# Patient Record
Sex: Male | Born: 1935 | Race: White | Hispanic: No | Marital: Single
Health system: Southern US, Community
[De-identification: ages and names within clinical notes are randomized; demographics above are authoritative.]

---

## 2000-01-15 ENCOUNTER — Encounter: Payer: Self-pay | Admitting: Vascular Surgery

## 2000-01-19 ENCOUNTER — Encounter: Payer: Self-pay | Admitting: Vascular Surgery

## 2000-01-19 ENCOUNTER — Inpatient Hospital Stay (HOSPITAL_COMMUNITY): Admission: RE | Admit: 2000-01-19 | Discharge: 2000-01-23 | Payer: Self-pay | Admitting: Vascular Surgery

## 2000-02-25 ENCOUNTER — Observation Stay (HOSPITAL_COMMUNITY): Admission: RE | Admit: 2000-02-25 | Discharge: 2000-02-26 | Payer: Self-pay | Admitting: Vascular Surgery

## 2004-02-27 ENCOUNTER — Ambulatory Visit: Payer: Self-pay | Admitting: Urology

## 2004-10-26 ENCOUNTER — Other Ambulatory Visit: Payer: Self-pay

## 2004-10-26 ENCOUNTER — Inpatient Hospital Stay: Payer: Self-pay

## 2005-06-08 ENCOUNTER — Ambulatory Visit: Payer: Self-pay | Admitting: Vascular Surgery

## 2005-10-03 ENCOUNTER — Inpatient Hospital Stay: Payer: Self-pay | Admitting: Unknown Physician Specialty

## 2005-10-14 ENCOUNTER — Ambulatory Visit: Payer: Self-pay | Admitting: Unknown Physician Specialty

## 2005-10-15 ENCOUNTER — Ambulatory Visit: Payer: Self-pay | Admitting: Unknown Physician Specialty

## 2005-12-21 ENCOUNTER — Ambulatory Visit: Payer: Self-pay | Admitting: Ophthalmology

## 2005-12-27 ENCOUNTER — Ambulatory Visit: Payer: Self-pay | Admitting: Ophthalmology

## 2006-07-26 ENCOUNTER — Ambulatory Visit: Payer: Self-pay | Admitting: Internal Medicine

## 2006-08-03 ENCOUNTER — Ambulatory Visit: Payer: Self-pay | Admitting: Vascular Surgery

## 2006-08-16 ENCOUNTER — Ambulatory Visit: Payer: Self-pay | Admitting: Internal Medicine

## 2006-09-15 ENCOUNTER — Ambulatory Visit: Payer: Self-pay | Admitting: Internal Medicine

## 2006-09-22 ENCOUNTER — Other Ambulatory Visit: Payer: Self-pay

## 2006-09-22 ENCOUNTER — Ambulatory Visit: Payer: Self-pay | Admitting: Gastroenterology

## 2006-09-27 ENCOUNTER — Ambulatory Visit: Payer: Self-pay | Admitting: Gastroenterology

## 2006-10-16 ENCOUNTER — Ambulatory Visit: Payer: Self-pay | Admitting: Internal Medicine

## 2006-11-15 ENCOUNTER — Ambulatory Visit: Payer: Self-pay | Admitting: Internal Medicine

## 2006-12-16 ENCOUNTER — Ambulatory Visit: Payer: Self-pay | Admitting: Internal Medicine

## 2007-01-16 ENCOUNTER — Ambulatory Visit: Payer: Self-pay | Admitting: Internal Medicine

## 2007-02-01 ENCOUNTER — Ambulatory Visit: Payer: Self-pay | Admitting: Podiatry

## 2007-02-15 ENCOUNTER — Ambulatory Visit: Payer: Self-pay | Admitting: Internal Medicine

## 2007-03-18 ENCOUNTER — Ambulatory Visit: Payer: Self-pay | Admitting: Internal Medicine

## 2007-04-17 ENCOUNTER — Ambulatory Visit: Payer: Self-pay | Admitting: Internal Medicine

## 2007-04-18 ENCOUNTER — Ambulatory Visit: Payer: Self-pay | Admitting: Internal Medicine

## 2007-05-18 ENCOUNTER — Ambulatory Visit: Payer: Self-pay | Admitting: Internal Medicine

## 2007-06-13 ENCOUNTER — Ambulatory Visit: Payer: Self-pay | Admitting: Vascular Surgery

## 2007-09-15 ENCOUNTER — Ambulatory Visit: Payer: Self-pay | Admitting: Internal Medicine

## 2007-10-04 ENCOUNTER — Ambulatory Visit: Payer: Self-pay | Admitting: Internal Medicine

## 2007-10-06 ENCOUNTER — Other Ambulatory Visit: Payer: Self-pay

## 2007-10-06 ENCOUNTER — Emergency Department: Payer: Self-pay | Admitting: Emergency Medicine

## 2007-10-16 ENCOUNTER — Ambulatory Visit: Payer: Self-pay | Admitting: Internal Medicine

## 2008-04-18 ENCOUNTER — Ambulatory Visit: Payer: Self-pay | Admitting: Ophthalmology

## 2008-05-07 ENCOUNTER — Ambulatory Visit: Payer: Self-pay | Admitting: Ophthalmology

## 2008-12-18 ENCOUNTER — Ambulatory Visit: Payer: Self-pay | Admitting: Podiatry

## 2009-01-07 ENCOUNTER — Ambulatory Visit: Payer: Self-pay | Admitting: Podiatry

## 2009-01-10 ENCOUNTER — Ambulatory Visit: Payer: Self-pay | Admitting: Podiatry

## 2009-04-22 ENCOUNTER — Ambulatory Visit: Payer: Self-pay | Admitting: Podiatry

## 2009-04-23 ENCOUNTER — Inpatient Hospital Stay: Payer: Self-pay | Admitting: Podiatry

## 2009-05-22 ENCOUNTER — Inpatient Hospital Stay: Payer: Self-pay | Admitting: Internal Medicine

## 2009-07-28 ENCOUNTER — Ambulatory Visit: Payer: Self-pay | Admitting: Gastroenterology

## 2009-10-21 ENCOUNTER — Emergency Department: Payer: Self-pay | Admitting: Emergency Medicine

## 2009-10-24 ENCOUNTER — Observation Stay: Payer: Self-pay | Admitting: Internal Medicine

## 2010-11-17 ENCOUNTER — Ambulatory Visit: Payer: Self-pay | Admitting: Internal Medicine

## 2010-12-03 ENCOUNTER — Ambulatory Visit: Payer: Self-pay | Admitting: Vascular Surgery

## 2010-12-09 ENCOUNTER — Ambulatory Visit: Payer: Self-pay | Admitting: Vascular Surgery

## 2010-12-25 ENCOUNTER — Ambulatory Visit: Payer: Self-pay | Admitting: Family Medicine

## 2011-01-20 ENCOUNTER — Inpatient Hospital Stay: Payer: Self-pay | Admitting: Internal Medicine

## 2011-01-29 DIAGNOSIS — R072 Precordial pain: Secondary | ICD-10-CM

## 2011-02-01 ENCOUNTER — Encounter: Payer: Self-pay | Admitting: Internal Medicine

## 2011-02-15 ENCOUNTER — Encounter: Payer: Self-pay | Admitting: Internal Medicine

## 2011-03-18 ENCOUNTER — Encounter: Payer: Self-pay | Admitting: Internal Medicine

## 2011-03-22 ENCOUNTER — Inpatient Hospital Stay: Payer: Self-pay | Admitting: Vascular Surgery

## 2011-04-05 ENCOUNTER — Encounter: Payer: Self-pay | Admitting: Internal Medicine

## 2011-04-17 ENCOUNTER — Encounter: Payer: Self-pay | Admitting: Internal Medicine

## 2011-04-30 ENCOUNTER — Ambulatory Visit: Payer: Self-pay | Admitting: Podiatry

## 2011-05-03 LAB — PATHOLOGY REPORT

## 2011-05-18 ENCOUNTER — Encounter: Payer: Self-pay | Admitting: Internal Medicine

## 2011-06-18 ENCOUNTER — Encounter: Payer: Self-pay | Admitting: Internal Medicine

## 2011-06-24 ENCOUNTER — Ambulatory Visit: Payer: Self-pay | Admitting: Podiatry

## 2011-06-24 LAB — BASIC METABOLIC PANEL
Anion Gap: 8 (ref 7–16)
BUN: 41 mg/dL — ABNORMAL HIGH (ref 7–18)
Calcium, Total: 9.3 mg/dL (ref 8.5–10.1)
Chloride: 108 mmol/L — ABNORMAL HIGH (ref 98–107)
Co2: 28 mmol/L (ref 21–32)
Creatinine: 1.75 mg/dL — ABNORMAL HIGH (ref 0.60–1.30)
EGFR (African American): 49 — ABNORMAL LOW
EGFR (Non-African Amer.): 41 — ABNORMAL LOW
Glucose: 139 mg/dL — ABNORMAL HIGH (ref 65–99)
Osmolality: 299 (ref 275–301)
Potassium: 4.4 mmol/L (ref 3.5–5.1)
Sodium: 144 mmol/L (ref 136–145)

## 2011-06-24 LAB — WBC: WBC: 7.3 10*3/uL (ref 3.8–10.6)

## 2011-08-03 ENCOUNTER — Other Ambulatory Visit: Payer: Self-pay | Admitting: Podiatry

## 2011-08-08 LAB — WOUND CULTURE

## 2011-08-16 ENCOUNTER — Ambulatory Visit: Payer: Self-pay | Admitting: Internal Medicine

## 2011-08-18 ENCOUNTER — Ambulatory Visit: Payer: Self-pay | Admitting: Podiatry

## 2011-08-25 ENCOUNTER — Inpatient Hospital Stay: Payer: Self-pay | Admitting: Internal Medicine

## 2011-08-25 LAB — BASIC METABOLIC PANEL
Anion Gap: 7 (ref 7–16)
BUN: 42 mg/dL — ABNORMAL HIGH (ref 7–18)
Calcium, Total: 8.7 mg/dL (ref 8.5–10.1)
Chloride: 103 mmol/L (ref 98–107)
Co2: 29 mmol/L (ref 21–32)
Creatinine: 2.34 mg/dL — ABNORMAL HIGH (ref 0.60–1.30)
EGFR (African American): 35 — ABNORMAL LOW
EGFR (Non-African Amer.): 29 — ABNORMAL LOW
Glucose: 115 mg/dL — ABNORMAL HIGH (ref 65–99)
Osmolality: 289 (ref 275–301)
Potassium: 3.9 mmol/L (ref 3.5–5.1)
Sodium: 139 mmol/L (ref 136–145)

## 2011-08-25 LAB — CBC WITH DIFFERENTIAL/PLATELET
Basophil #: 0 10*3/uL (ref 0.0–0.1)
Basophil %: 0.5 %
Eosinophil #: 0 10*3/uL (ref 0.0–0.7)
Eosinophil %: 0.5 %
HCT: 29.6 % — ABNORMAL LOW (ref 40.0–52.0)
HGB: 9.7 g/dL — ABNORMAL LOW (ref 13.0–18.0)
Lymphocyte #: 1.5 10*3/uL (ref 1.0–3.6)
Lymphocyte %: 22 %
MCH: 27.6 pg (ref 26.0–34.0)
MCHC: 32.8 g/dL (ref 32.0–36.0)
MCV: 84 fL (ref 80–100)
Monocyte #: 1.2 x10 3/mm — ABNORMAL HIGH (ref 0.2–1.0)
Monocyte %: 17.1 %
Neutrophil #: 4.1 10*3/uL (ref 1.4–6.5)
Neutrophil %: 59.9 %
Platelet: 185 10*3/uL (ref 150–440)
RBC: 3.51 10*6/uL — ABNORMAL LOW (ref 4.40–5.90)
RDW: 16.5 % — ABNORMAL HIGH (ref 11.5–14.5)
WBC: 6.8 10*3/uL (ref 3.8–10.6)

## 2011-08-27 LAB — BASIC METABOLIC PANEL
Anion Gap: 9 (ref 7–16)
BUN: 44 mg/dL — ABNORMAL HIGH (ref 7–18)
Calcium, Total: 8.1 mg/dL — ABNORMAL LOW (ref 8.5–10.1)
Chloride: 100 mmol/L (ref 98–107)
Co2: 25 mmol/L (ref 21–32)
Creatinine: 2.15 mg/dL — ABNORMAL HIGH (ref 0.60–1.30)
EGFR (African American): 34 — ABNORMAL LOW
EGFR (Non-African Amer.): 29 — ABNORMAL LOW
Glucose: 115 mg/dL — ABNORMAL HIGH (ref 65–99)
Osmolality: 280 (ref 275–301)
Potassium: 3.8 mmol/L (ref 3.5–5.1)
Sodium: 134 mmol/L — ABNORMAL LOW (ref 136–145)

## 2011-08-27 LAB — PATHOLOGY REPORT

## 2011-08-28 LAB — BASIC METABOLIC PANEL
BUN: 46 mg/dL — ABNORMAL HIGH (ref 7–18)
Calcium, Total: 8.4 mg/dL — ABNORMAL LOW (ref 8.5–10.1)
Co2: 24 mmol/L (ref 21–32)
Creatinine: 2.41 mg/dL — ABNORMAL HIGH (ref 0.60–1.30)
EGFR (Non-African Amer.): 25 — ABNORMAL LOW
Osmolality: 286 (ref 275–301)
Sodium: 134 mmol/L — ABNORMAL LOW (ref 136–145)

## 2011-08-29 LAB — CBC WITH DIFFERENTIAL/PLATELET
Basophil #: 0 10*3/uL (ref 0.0–0.1)
Basophil %: 0.2 %
Basophil %: 0.1 %
Eosinophil #: 0 10*3/uL (ref 0.0–0.7)
HCT: 26.2 % — ABNORMAL LOW (ref 40.0–52.0)
HGB: 8.6 g/dL — ABNORMAL LOW (ref 13.0–18.0)
HGB: 8.7 g/dL — ABNORMAL LOW (ref 13.0–18.0)
Lymphocyte #: 0.9 10*3/uL — ABNORMAL LOW (ref 1.0–3.6)
Lymphocyte #: 0.8 10*3/uL — ABNORMAL LOW (ref 1.0–3.6)
Lymphocyte %: 10.3 %
Lymphocyte %: 8.2 %
MCHC: 32.7 g/dL (ref 32.0–36.0)
MCHC: 33 g/dL (ref 32.0–36.0)
MCV: 84 fL (ref 80–100)
Monocyte #: 1.1 x10 3/mm — ABNORMAL HIGH (ref 0.2–1.0)
Monocyte #: 0.4 x10 3/mm (ref 0.2–1.0)
Monocyte %: 11.8 %
Monocyte %: 4.7 %
Neutrophil #: 6.6 10*3/uL — ABNORMAL HIGH (ref 1.4–6.5)
Neutrophil %: 73.9 %
Platelet: 162 10*3/uL (ref 150–440)
RBC: 3.16 10*6/uL — ABNORMAL LOW (ref 4.40–5.90)
RDW: 16.3 % — ABNORMAL HIGH (ref 11.5–14.5)

## 2011-08-29 LAB — BASIC METABOLIC PANEL
Anion Gap: 10 (ref 7–16)
BUN: 54 mg/dL — ABNORMAL HIGH (ref 7–18)
Calcium, Total: 8.3 mg/dL — ABNORMAL LOW (ref 8.5–10.1)
Co2: 26 mmol/L (ref 21–32)
Creatinine: 2.77 mg/dL — ABNORMAL HIGH (ref 0.60–1.30)
EGFR (African American): 25 — ABNORMAL LOW
EGFR (Non-African Amer.): 22 — ABNORMAL LOW
Glucose: 167 mg/dL — ABNORMAL HIGH (ref 65–99)
Sodium: 130 mmol/L — ABNORMAL LOW (ref 136–145)

## 2011-08-29 LAB — TROPONIN I: Troponin-I: 0.45 ng/mL — ABNORMAL HIGH

## 2011-08-29 LAB — CK-MB: CK-MB: 1.3 ng/mL (ref 0.5–3.6)

## 2011-08-30 LAB — COMPREHENSIVE METABOLIC PANEL
Albumin: 2.2 g/dL — ABNORMAL LOW (ref 3.4–5.0)
Alkaline Phosphatase: 88 U/L (ref 50–136)
Anion Gap: 11 (ref 7–16)
Bilirubin,Total: 0.5 mg/dL (ref 0.2–1.0)
Co2: 23 mmol/L (ref 21–32)
EGFR (African American): 26 — ABNORMAL LOW
EGFR (Non-African Amer.): 22 — ABNORMAL LOW
Potassium: 3.7 mmol/L (ref 3.5–5.1)
SGOT(AST): 32 U/L (ref 15–37)
Sodium: 135 mmol/L — ABNORMAL LOW (ref 136–145)
Total Protein: 6 g/dL — ABNORMAL LOW (ref 6.4–8.2)

## 2011-08-30 LAB — MAGNESIUM
Magnesium: 2.6 mg/dL — ABNORMAL HIGH
Magnesium: 2.5 mg/dL — ABNORMAL HIGH

## 2011-08-30 LAB — CBC WITH DIFFERENTIAL/PLATELET
Basophil #: 0 10*3/uL (ref 0.0–0.1)
Eosinophil #: 0 10*3/uL (ref 0.0–0.7)
Eosinophil %: 0.1 %
HGB: 8.7 g/dL — ABNORMAL LOW (ref 13.0–18.0)
Lymphocyte #: 1 10*3/uL (ref 1.0–3.6)
MCHC: 32.3 g/dL (ref 32.0–36.0)
MCV: 83 fL (ref 80–100)
Monocyte #: 0.4 x10 3/mm (ref 0.2–1.0)
Monocyte %: 4.2 %
Neutrophil #: 7.7 10*3/uL — ABNORMAL HIGH (ref 1.4–6.5)
RBC: 3.25 10*6/uL — ABNORMAL LOW (ref 4.40–5.90)
RDW: 16.5 % — ABNORMAL HIGH (ref 11.5–14.5)
WBC: 9.1 10*3/uL (ref 3.8–10.6)

## 2011-08-30 LAB — TROPONIN I
Troponin-I: 0.51 ng/mL — ABNORMAL HIGH
Troponin-I: 0.5 ng/mL — ABNORMAL HIGH

## 2011-08-30 LAB — BASIC METABOLIC PANEL
Anion Gap: 12 (ref 7–16)
BUN: 53 mg/dL — ABNORMAL HIGH (ref 7–18)
Calcium, Total: 8.2 mg/dL — ABNORMAL LOW (ref 8.5–10.1)
Chloride: 101 mmol/L (ref 98–107)
EGFR (African American): 30 — ABNORMAL LOW
EGFR (Non-African Amer.): 26 — ABNORMAL LOW
Glucose: 333 mg/dL — ABNORMAL HIGH (ref 65–99)
Sodium: 135 mmol/L — ABNORMAL LOW (ref 136–145)

## 2011-08-30 LAB — TSH: Thyroid Stimulating Horm: 0.605 u[IU]/mL

## 2011-08-31 LAB — CBC WITH DIFFERENTIAL/PLATELET
Eosinophil %: 0.1 %
Lymphocyte #: 1 10*3/uL (ref 1.0–3.6)
Lymphocyte %: 8.2 %
MCH: 26.6 pg (ref 26.0–34.0)
MCHC: 32.5 g/dL (ref 32.0–36.0)
MCV: 82 fL (ref 80–100)
Neutrophil #: 10.1 10*3/uL — ABNORMAL HIGH (ref 1.4–6.5)
Neutrophil %: 86.3 %
Platelet: 198 10*3/uL (ref 150–440)
RBC: 3.23 10*6/uL — ABNORMAL LOW (ref 4.40–5.90)
RDW: 16.4 % — ABNORMAL HIGH (ref 11.5–14.5)
WBC: 11.7 10*3/uL — ABNORMAL HIGH (ref 3.8–10.6)

## 2011-08-31 LAB — BASIC METABOLIC PANEL
Anion Gap: 11 (ref 7–16)
BUN: 46 mg/dL — ABNORMAL HIGH (ref 7–18)
Calcium, Total: 8.3 mg/dL — ABNORMAL LOW (ref 8.5–10.1)
Creatinine: 2.08 mg/dL — ABNORMAL HIGH (ref 0.60–1.30)
EGFR (African American): 35 — ABNORMAL LOW
Osmolality: 297 (ref 275–301)
Potassium: 3.1 mmol/L — ABNORMAL LOW (ref 3.5–5.1)

## 2011-08-31 LAB — WOUND CULTURE

## 2011-09-01 LAB — CBC WITH DIFFERENTIAL/PLATELET
Basophil #: 0 10*3/uL (ref 0.0–0.1)
Basophil %: 0.1 %
Eosinophil #: 0 10*3/uL (ref 0.0–0.7)
Eosinophil %: 0 %
HCT: 26.4 % — ABNORMAL LOW (ref 40.0–52.0)
HGB: 8.4 g/dL — ABNORMAL LOW (ref 13.0–18.0)
Lymphocyte #: 1 10*3/uL (ref 1.0–3.6)
MCH: 26.6 pg (ref 26.0–34.0)
MCHC: 31.8 g/dL — ABNORMAL LOW (ref 32.0–36.0)
MCV: 84 fL (ref 80–100)
Monocyte #: 0.7 x10 3/mm (ref 0.2–1.0)
Monocyte %: 5 %
Neutrophil #: 12.4 10*3/uL — ABNORMAL HIGH (ref 1.4–6.5)
Neutrophil %: 87.6 %
Platelet: 233 10*3/uL (ref 150–440)
RDW: 17 % — ABNORMAL HIGH (ref 11.5–14.5)

## 2011-09-01 LAB — BASIC METABOLIC PANEL
BUN: 48 mg/dL — ABNORMAL HIGH (ref 7–18)
Co2: 23 mmol/L (ref 21–32)
Creatinine: 1.98 mg/dL — ABNORMAL HIGH (ref 0.60–1.30)
EGFR (African American): 37 — ABNORMAL LOW
Glucose: 272 mg/dL — ABNORMAL HIGH (ref 65–99)
Potassium: 3.4 mmol/L — ABNORMAL LOW (ref 3.5–5.1)

## 2011-09-01 LAB — MAGNESIUM: Magnesium: 2.6 mg/dL — ABNORMAL HIGH

## 2011-09-01 LAB — PHOSPHORUS: Phosphorus: 3.7 mg/dL (ref 2.5–4.9)

## 2011-09-01 LAB — POTASSIUM: Potassium: 4.4 mmol/L (ref 3.5–5.1)

## 2011-09-02 LAB — URINALYSIS, COMPLETE
Bilirubin,UR: NEGATIVE
Nitrite: NEGATIVE
Ph: 5 (ref 4.5–8.0)
RBC,UR: 104 /HPF (ref 0–5)
WBC UR: 2 /HPF (ref 0–5)

## 2011-09-02 LAB — CBC WITH DIFFERENTIAL/PLATELET
HCT: 27.4 % — ABNORMAL LOW (ref 40.0–52.0)
HGB: 8.6 g/dL — ABNORMAL LOW (ref 13.0–18.0)
Myelocyte: 4 %
RBC: 3.21 10*6/uL — ABNORMAL LOW (ref 4.40–5.90)
RDW: 17.2 % — ABNORMAL HIGH (ref 11.5–14.5)
WBC: 14.1 10*3/uL — ABNORMAL HIGH (ref 3.8–10.6)

## 2011-09-02 LAB — BASIC METABOLIC PANEL
BUN: 64 mg/dL — ABNORMAL HIGH (ref 7–18)
Calcium, Total: 8.4 mg/dL — ABNORMAL LOW (ref 8.5–10.1)
Chloride: 105 mmol/L (ref 98–107)
Sodium: 138 mmol/L (ref 136–145)

## 2011-09-02 LAB — PROTEIN / CREATININE RATIO, URINE
Creatinine, Urine: 200.1 mg/dL — ABNORMAL HIGH (ref 30.0–125.0)
Protein/Creat. Ratio: 425 mg/gCREAT — ABNORMAL HIGH (ref 0–200)

## 2011-09-02 LAB — PHOSPHORUS: Phosphorus: 4.3 mg/dL (ref 2.5–4.9)

## 2011-09-03 LAB — CBC WITH DIFFERENTIAL/PLATELET
Basophil #: 0 10*3/uL (ref 0.0–0.1)
Basophil %: 0.1 %
Lymphocyte #: 1.4 10*3/uL (ref 1.0–3.6)
MCH: 26.8 pg (ref 26.0–34.0)
MCHC: 31.9 g/dL — ABNORMAL LOW (ref 32.0–36.0)
MCV: 84 fL (ref 80–100)
Monocyte #: 0.6 x10 3/mm (ref 0.2–1.0)
Monocyte %: 4.4 %
Neutrophil #: 11.9 10*3/uL — ABNORMAL HIGH (ref 1.4–6.5)
Platelet: 222 10*3/uL (ref 150–440)
RDW: 16.9 % — ABNORMAL HIGH (ref 11.5–14.5)

## 2011-09-03 LAB — BASIC METABOLIC PANEL
Anion Gap: 12 (ref 7–16)
BUN: 80 mg/dL — ABNORMAL HIGH (ref 7–18)
Calcium, Total: 8 mg/dL — ABNORMAL LOW (ref 8.5–10.1)
Co2: 20 mmol/L — ABNORMAL LOW (ref 21–32)
Creatinine: 2.32 mg/dL — ABNORMAL HIGH (ref 0.60–1.30)
EGFR (Non-African Amer.): 27 — ABNORMAL LOW
Glucose: 236 mg/dL — ABNORMAL HIGH (ref 65–99)
Osmolality: 306 (ref 275–301)
Potassium: 4.3 mmol/L (ref 3.5–5.1)
Sodium: 137 mmol/L (ref 136–145)

## 2011-09-04 LAB — CBC WITH DIFFERENTIAL/PLATELET
Basophil %: 0 %
Eosinophil %: 0.1 %
Lymphocyte #: 1.8 10*3/uL (ref 1.0–3.6)
Lymphocyte %: 10.2 %
MCH: 26.6 pg (ref 26.0–34.0)
MCHC: 32 g/dL (ref 32.0–36.0)
MCV: 83 fL (ref 80–100)
Monocyte %: 6.5 %
Neutrophil #: 14.6 10*3/uL — ABNORMAL HIGH (ref 1.4–6.5)
Neutrophil %: 83.2 %
RBC: 3.41 10*6/uL — ABNORMAL LOW (ref 4.40–5.90)
RDW: 17.3 % — ABNORMAL HIGH (ref 11.5–14.5)
WBC: 17.6 10*3/uL — ABNORMAL HIGH (ref 3.8–10.6)

## 2011-09-04 LAB — BASIC METABOLIC PANEL
Anion Gap: 13 (ref 7–16)
Calcium, Total: 8.1 mg/dL — ABNORMAL LOW (ref 8.5–10.1)
Chloride: 106 mmol/L (ref 98–107)
Co2: 22 mmol/L (ref 21–32)
Creatinine: 2.04 mg/dL — ABNORMAL HIGH (ref 0.60–1.30)
Osmolality: 315 (ref 275–301)
Sodium: 141 mmol/L (ref 136–145)

## 2011-09-05 LAB — BASIC METABOLIC PANEL
BUN: 91 mg/dL — ABNORMAL HIGH (ref 7–18)
Calcium, Total: 7.9 mg/dL — ABNORMAL LOW (ref 8.5–10.1)
Chloride: 111 mmol/L — ABNORMAL HIGH (ref 98–107)
Creatinine: 1.87 mg/dL — ABNORMAL HIGH (ref 0.60–1.30)
EGFR (African American): 40 — ABNORMAL LOW
Osmolality: 320 (ref 275–301)
Sodium: 145 mmol/L (ref 136–145)

## 2011-09-05 LAB — CBC WITH DIFFERENTIAL/PLATELET
Basophil #: 0 10*3/uL (ref 0.0–0.1)
Basophil %: 0.1 %
HCT: 28.3 % — ABNORMAL LOW (ref 40.0–52.0)
HGB: 9 g/dL — ABNORMAL LOW (ref 13.0–18.0)
Lymphocyte #: 1.8 10*3/uL (ref 1.0–3.6)
Lymphocyte %: 12.8 %
MCH: 26.7 pg (ref 26.0–34.0)
MCV: 84 fL (ref 80–100)
Monocyte #: 0.9 x10 3/mm (ref 0.2–1.0)
Monocyte %: 6.4 %
Neutrophil #: 11.6 10*3/uL — ABNORMAL HIGH (ref 1.4–6.5)
Neutrophil %: 79.8 %
Platelet: 216 10*3/uL (ref 150–440)
RDW: 17.5 % — ABNORMAL HIGH (ref 11.5–14.5)
WBC: 14.5 10*3/uL — ABNORMAL HIGH (ref 3.8–10.6)

## 2011-09-06 LAB — EXPECTORATED SPUTUM ASSESSMENT W GRAM STAIN, RFLX TO RESP C

## 2011-09-06 LAB — CBC WITH DIFFERENTIAL/PLATELET
Eosinophil %: 4.9 %
HCT: 30.4 % — ABNORMAL LOW (ref 40.0–52.0)
Lymphocyte #: 2.3 10*3/uL (ref 1.0–3.6)
Lymphocyte %: 17.9 %
MCH: 26.7 pg (ref 26.0–34.0)
MCHC: 31.2 g/dL — ABNORMAL LOW (ref 32.0–36.0)
MCV: 86 fL (ref 80–100)
Monocyte %: 6.2 %
Neutrophil %: 70.9 %
Platelet: 203 10*3/uL (ref 150–440)
RDW: 17.7 % — ABNORMAL HIGH (ref 11.5–14.5)

## 2011-09-06 LAB — BASIC METABOLIC PANEL
BUN: 82 mg/dL — ABNORMAL HIGH (ref 7–18)
Calcium, Total: 7.7 mg/dL — ABNORMAL LOW (ref 8.5–10.1)
EGFR (African American): 47 — ABNORMAL LOW
EGFR (Non-African Amer.): 40 — ABNORMAL LOW
Osmolality: 319 (ref 275–301)
Potassium: 4.4 mmol/L (ref 3.5–5.1)
Sodium: 148 mmol/L — ABNORMAL HIGH (ref 136–145)

## 2011-09-07 LAB — CBC WITH DIFFERENTIAL/PLATELET
Basophil #: 0 10*3/uL (ref 0.0–0.1)
Basophil %: 0.2 %
Eosinophil %: 4.6 %
Lymphocyte %: 15.4 %
MCV: 85 fL (ref 80–100)
Monocyte #: 0.7 x10 3/mm (ref 0.2–1.0)
Neutrophil #: 8.5 10*3/uL — ABNORMAL HIGH (ref 1.4–6.5)
Neutrophil %: 74.1 %
Platelet: 198 10*3/uL (ref 150–440)
RBC: 3.92 10*6/uL — ABNORMAL LOW (ref 4.40–5.90)
RDW: 17.9 % — ABNORMAL HIGH (ref 11.5–14.5)
WBC: 11.5 10*3/uL — ABNORMAL HIGH (ref 3.8–10.6)

## 2011-09-07 LAB — COMPREHENSIVE METABOLIC PANEL
Alkaline Phosphatase: 130 U/L (ref 50–136)
BUN: 65 mg/dL — ABNORMAL HIGH (ref 7–18)
Chloride: 112 mmol/L — ABNORMAL HIGH (ref 98–107)
Co2: 25 mmol/L (ref 21–32)
EGFR (African American): 58 — ABNORMAL LOW
Glucose: 169 mg/dL — ABNORMAL HIGH (ref 65–99)
Osmolality: 313 (ref 275–301)
Potassium: 4.5 mmol/L (ref 3.5–5.1)
SGPT (ALT): 15 U/L
Total Protein: 5.9 g/dL — ABNORMAL LOW (ref 6.4–8.2)

## 2011-09-07 LAB — RENAL FUNCTION PANEL
Anion Gap: 9 (ref 7–16)
Calcium, Total: 8.2 mg/dL — ABNORMAL LOW (ref 8.5–10.1)
Chloride: 112 mmol/L — ABNORMAL HIGH (ref 98–107)
Co2: 25 mmol/L (ref 21–32)
Creatinine: 1.29 mg/dL (ref 0.60–1.30)
EGFR (Non-African Amer.): 54 — ABNORMAL LOW
Glucose: 170 mg/dL — ABNORMAL HIGH (ref 65–99)
Osmolality: 314 (ref 275–301)
Sodium: 146 mmol/L — ABNORMAL HIGH (ref 136–145)

## 2011-09-08 LAB — CBC WITH DIFFERENTIAL/PLATELET
Basophil #: 0 10*3/uL (ref 0.0–0.1)
Basophil %: 0.2 %
Eosinophil %: 2.3 %
HGB: 10.8 g/dL — ABNORMAL LOW (ref 13.0–18.0)
Lymphocyte #: 1.9 10*3/uL (ref 1.0–3.6)
Lymphocyte %: 16.9 %
MCH: 26.7 pg (ref 26.0–34.0)
MCHC: 31.6 g/dL — ABNORMAL LOW (ref 32.0–36.0)
MCV: 85 fL (ref 80–100)
Monocyte #: 0.8 x10 3/mm (ref 0.2–1.0)
Monocyte %: 6.9 %
Neutrophil %: 73.7 %
Platelet: 182 10*3/uL (ref 150–440)
RBC: 4.02 10*6/uL — ABNORMAL LOW (ref 4.40–5.90)
RDW: 18.1 % — ABNORMAL HIGH (ref 11.5–14.5)

## 2011-09-08 LAB — RENAL FUNCTION PANEL
Anion Gap: 8 (ref 7–16)
BUN: 50 mg/dL — ABNORMAL HIGH (ref 7–18)
Calcium, Total: 8 mg/dL — ABNORMAL LOW (ref 8.5–10.1)
Chloride: 108 mmol/L — ABNORMAL HIGH (ref 98–107)
Creatinine: 1.2 mg/dL (ref 0.60–1.30)
EGFR (African American): >60
Glucose: 191 mg/dL — ABNORMAL HIGH (ref 65–99)
Phosphorus: 2.6 mg/dL (ref 2.5–4.9)
Potassium: 4.2 mmol/L (ref 3.5–5.1)
Sodium: 143 mmol/L (ref 136–145)

## 2011-09-09 LAB — CBC WITH DIFFERENTIAL/PLATELET
Basophil #: 0 10*3/uL (ref 0.0–0.1)
Basophil %: 0.1 %
Eosinophil #: 0.2 10*3/uL (ref 0.0–0.7)
Eosinophil %: 0.9 %
HCT: 33.5 % — ABNORMAL LOW (ref 40.0–52.0)
Lymphocyte #: 2.3 10*3/uL (ref 1.0–3.6)
MCH: 27.1 pg (ref 26.0–34.0)
MCHC: 31.8 g/dL — ABNORMAL LOW (ref 32.0–36.0)
MCV: 85 fL (ref 80–100)
Monocyte #: 1.4 x10 3/mm — ABNORMAL HIGH (ref 0.2–1.0)
Platelet: 192 10*3/uL (ref 150–440)
RBC: 3.93 10*6/uL — ABNORMAL LOW (ref 4.40–5.90)
WBC: 17.4 10*3/uL — ABNORMAL HIGH (ref 3.8–10.6)

## 2011-09-10 LAB — CBC WITH DIFFERENTIAL/PLATELET
Basophil #: 0 10*3/uL (ref 0.0–0.1)
Basophil %: 0.2 %
Eosinophil #: 0.1 10*3/uL (ref 0.0–0.7)
Eosinophil %: 0.6 %
HCT: 31.3 % — ABNORMAL LOW (ref 40.0–52.0)
MCH: 27.2 pg (ref 26.0–34.0)
MCV: 85 fL (ref 80–100)
Monocyte #: 2 x10 3/mm — ABNORMAL HIGH (ref 0.2–1.0)
Monocyte %: 9.5 %
Neutrophil #: 16.8 10*3/uL — ABNORMAL HIGH (ref 1.4–6.5)
Neutrophil %: 79.6 %
RBC: 3.67 10*6/uL — ABNORMAL LOW (ref 4.40–5.90)
WBC: 21.1 10*3/uL — ABNORMAL HIGH (ref 3.8–10.6)

## 2011-09-10 LAB — RENAL FUNCTION PANEL
Albumin: 2 g/dL — ABNORMAL LOW (ref 3.4–5.0)
Anion Gap: 7 (ref 7–16)
Co2: 26 mmol/L (ref 21–32)
Creatinine: 1.62 mg/dL — ABNORMAL HIGH (ref 0.60–1.30)
EGFR (Non-African Amer.): 41 — ABNORMAL LOW
Glucose: 136 mg/dL — ABNORMAL HIGH (ref 65–99)
Phosphorus: 3.4 mg/dL (ref 2.5–4.9)
Potassium: 4.6 mmol/L (ref 3.5–5.1)

## 2011-09-11 LAB — BASIC METABOLIC PANEL
Anion Gap: 7 (ref 7–16)
Calcium, Total: 7.8 mg/dL — ABNORMAL LOW (ref 8.5–10.1)
Creatinine: 1.61 mg/dL — ABNORMAL HIGH (ref 0.60–1.30)
EGFR (African American): 48 — ABNORMAL LOW
EGFR (Non-African Amer.): 42 — ABNORMAL LOW
Glucose: 82 mg/dL (ref 65–99)
Osmolality: 296 (ref 275–301)

## 2011-09-11 LAB — CBC WITH DIFFERENTIAL/PLATELET
Basophil #: 0.1 10*3/uL (ref 0.0–0.1)
Basophil %: 0.3 %
Eosinophil %: 1.5 %
HGB: 9.6 g/dL — ABNORMAL LOW (ref 13.0–18.0)
MCHC: 32.1 g/dL (ref 32.0–36.0)
MCV: 85 fL (ref 80–100)
Monocyte %: 9.7 %
RDW: 18.8 % — ABNORMAL HIGH (ref 11.5–14.5)
WBC: 18.4 10*3/uL — ABNORMAL HIGH (ref 3.8–10.6)

## 2011-09-12 LAB — CREATININE, SERUM
Creatinine: 1.6 mg/dL — ABNORMAL HIGH (ref 0.60–1.30)
EGFR (Non-African Amer.): 42 — ABNORMAL LOW

## 2011-09-12 LAB — STOOL CULTURE

## 2011-09-12 LAB — WBC: WBC: 16.4 10*3/uL — ABNORMAL HIGH (ref 3.8–10.6)

## 2011-09-13 LAB — BASIC METABOLIC PANEL
BUN: 49 mg/dL — ABNORMAL HIGH (ref 7–18)
Calcium, Total: 8 mg/dL — ABNORMAL LOW (ref 8.5–10.1)
Chloride: 103 mmol/L (ref 98–107)
Creatinine: 1.97 mg/dL — ABNORMAL HIGH (ref 0.60–1.30)
EGFR (African American): 38 — ABNORMAL LOW
Glucose: 65 mg/dL (ref 65–99)
Potassium: 4 mmol/L (ref 3.5–5.1)
Sodium: 139 mmol/L (ref 136–145)

## 2011-09-14 LAB — BASIC METABOLIC PANEL
Anion Gap: 10 (ref 7–16)
Calcium, Total: 7.6 mg/dL — ABNORMAL LOW (ref 8.5–10.1)
Chloride: 103 mmol/L (ref 98–107)
EGFR (African American): 35 — ABNORMAL LOW
EGFR (Non-African Amer.): 30 — ABNORMAL LOW
Osmolality: 292 (ref 275–301)
Sodium: 138 mmol/L (ref 136–145)

## 2011-09-14 LAB — CBC WITH DIFFERENTIAL/PLATELET
Basophil #: 0.1 10*3/uL (ref 0.0–0.1)
Eosinophil %: 0.4 %
HCT: 27.3 % — ABNORMAL LOW (ref 40.0–52.0)
Lymphocyte %: 12.8 %
MCHC: 32.2 g/dL (ref 32.0–36.0)
MCV: 84 fL (ref 80–100)
Monocyte #: 1.1 x10 3/mm — ABNORMAL HIGH (ref 0.2–1.0)
Neutrophil #: 17.8 10*3/uL — ABNORMAL HIGH (ref 1.4–6.5)
Neutrophil %: 81.3 %
RBC: 3.26 10*6/uL — ABNORMAL LOW (ref 4.40–5.90)
RDW: 19.3 % — ABNORMAL HIGH (ref 11.5–14.5)
WBC: 21.9 10*3/uL — ABNORMAL HIGH (ref 3.8–10.6)

## 2011-09-15 ENCOUNTER — Ambulatory Visit: Payer: Self-pay | Admitting: Internal Medicine

## 2011-09-15 LAB — BASIC METABOLIC PANEL
Anion Gap: 8 (ref 7–16)
BUN: 45 mg/dL — ABNORMAL HIGH (ref 7–18)
Creatinine: 1.97 mg/dL — ABNORMAL HIGH (ref 0.60–1.30)
EGFR (Non-African Amer.): 33 — ABNORMAL LOW
Glucose: 182 mg/dL — ABNORMAL HIGH (ref 65–99)
Osmolality: 288 (ref 275–301)
Potassium: 3.8 mmol/L (ref 3.5–5.1)
Sodium: 136 mmol/L (ref 136–145)

## 2011-09-15 LAB — CBC WITH DIFFERENTIAL/PLATELET
Basophil #: 0.1 10*3/uL (ref 0.0–0.1)
Basophil %: 1.1 %
Eosinophil #: 0.2 10*3/uL (ref 0.0–0.7)
Lymphocyte %: 19.2 %
MCH: 27.2 pg (ref 26.0–34.0)
MCHC: 32.5 g/dL (ref 32.0–36.0)
Monocyte %: 7.9 %
Neutrophil #: 8.2 10*3/uL — ABNORMAL HIGH (ref 1.4–6.5)
Neutrophil %: 70.1 %
Platelet: 170 10*3/uL (ref 150–440)
RDW: 19.3 % — ABNORMAL HIGH (ref 11.5–14.5)

## 2011-09-16 LAB — BASIC METABOLIC PANEL
BUN: 42 mg/dL — ABNORMAL HIGH (ref 7–18)
Calcium, Total: 7.8 mg/dL — ABNORMAL LOW (ref 8.5–10.1)
Co2: 27 mmol/L (ref 21–32)
EGFR (African American): 41 — ABNORMAL LOW
EGFR (Non-African Amer.): 35 — ABNORMAL LOW
Glucose: 145 mg/dL — ABNORMAL HIGH (ref 65–99)
Potassium: 4.2 mmol/L (ref 3.5–5.1)

## 2011-09-16 LAB — HEMOGLOBIN: HGB: 8.5 g/dL — ABNORMAL LOW (ref 13.0–18.0)

## 2011-09-17 LAB — CBC WITH DIFFERENTIAL/PLATELET
Basophil %: 1.3 %
Eosinophil #: 0.3 10*3/uL (ref 0.0–0.7)
HGB: 8.8 g/dL — ABNORMAL LOW (ref 13.0–18.0)
Lymphocyte %: 24.2 %
MCH: 27.7 pg (ref 26.0–34.0)
MCHC: 33.2 g/dL (ref 32.0–36.0)
Monocyte #: 0.9 x10 3/mm (ref 0.2–1.0)
RDW: 19.2 % — ABNORMAL HIGH (ref 11.5–14.5)
WBC: 8.6 10*3/uL (ref 3.8–10.6)

## 2011-09-18 LAB — CREATININE, SERUM: Creatinine: 1.65 mg/dL — ABNORMAL HIGH (ref 0.60–1.30)

## 2011-09-21 LAB — CBC WITH DIFFERENTIAL/PLATELET
Basophil #: 0.1 10*3/uL (ref 0.0–0.1)
Eosinophil #: 0.2 10*3/uL (ref 0.0–0.7)
Eosinophil %: 3.8 %
HCT: 28.4 % — ABNORMAL LOW (ref 40.0–52.0)
HGB: 9.3 g/dL — ABNORMAL LOW (ref 13.0–18.0)
MCH: 27.7 pg (ref 26.0–34.0)
Monocyte #: 1 x10 3/mm (ref 0.2–1.0)
Monocyte %: 15.3 %
Platelet: 233 10*3/uL (ref 150–440)
RDW: 20.1 % — ABNORMAL HIGH (ref 11.5–14.5)
WBC: 6.5 10*3/uL (ref 3.8–10.6)

## 2012-01-16 DEATH — deceased

## 2013-07-12 IMAGING — CR DG CHEST 1V PORT
1 series · 1 of 1 positions shown · non-contrast
Comparison: none

REASON FOR EXAM: s/p intubation, respiratory failure
COMMENTS:

[portable]
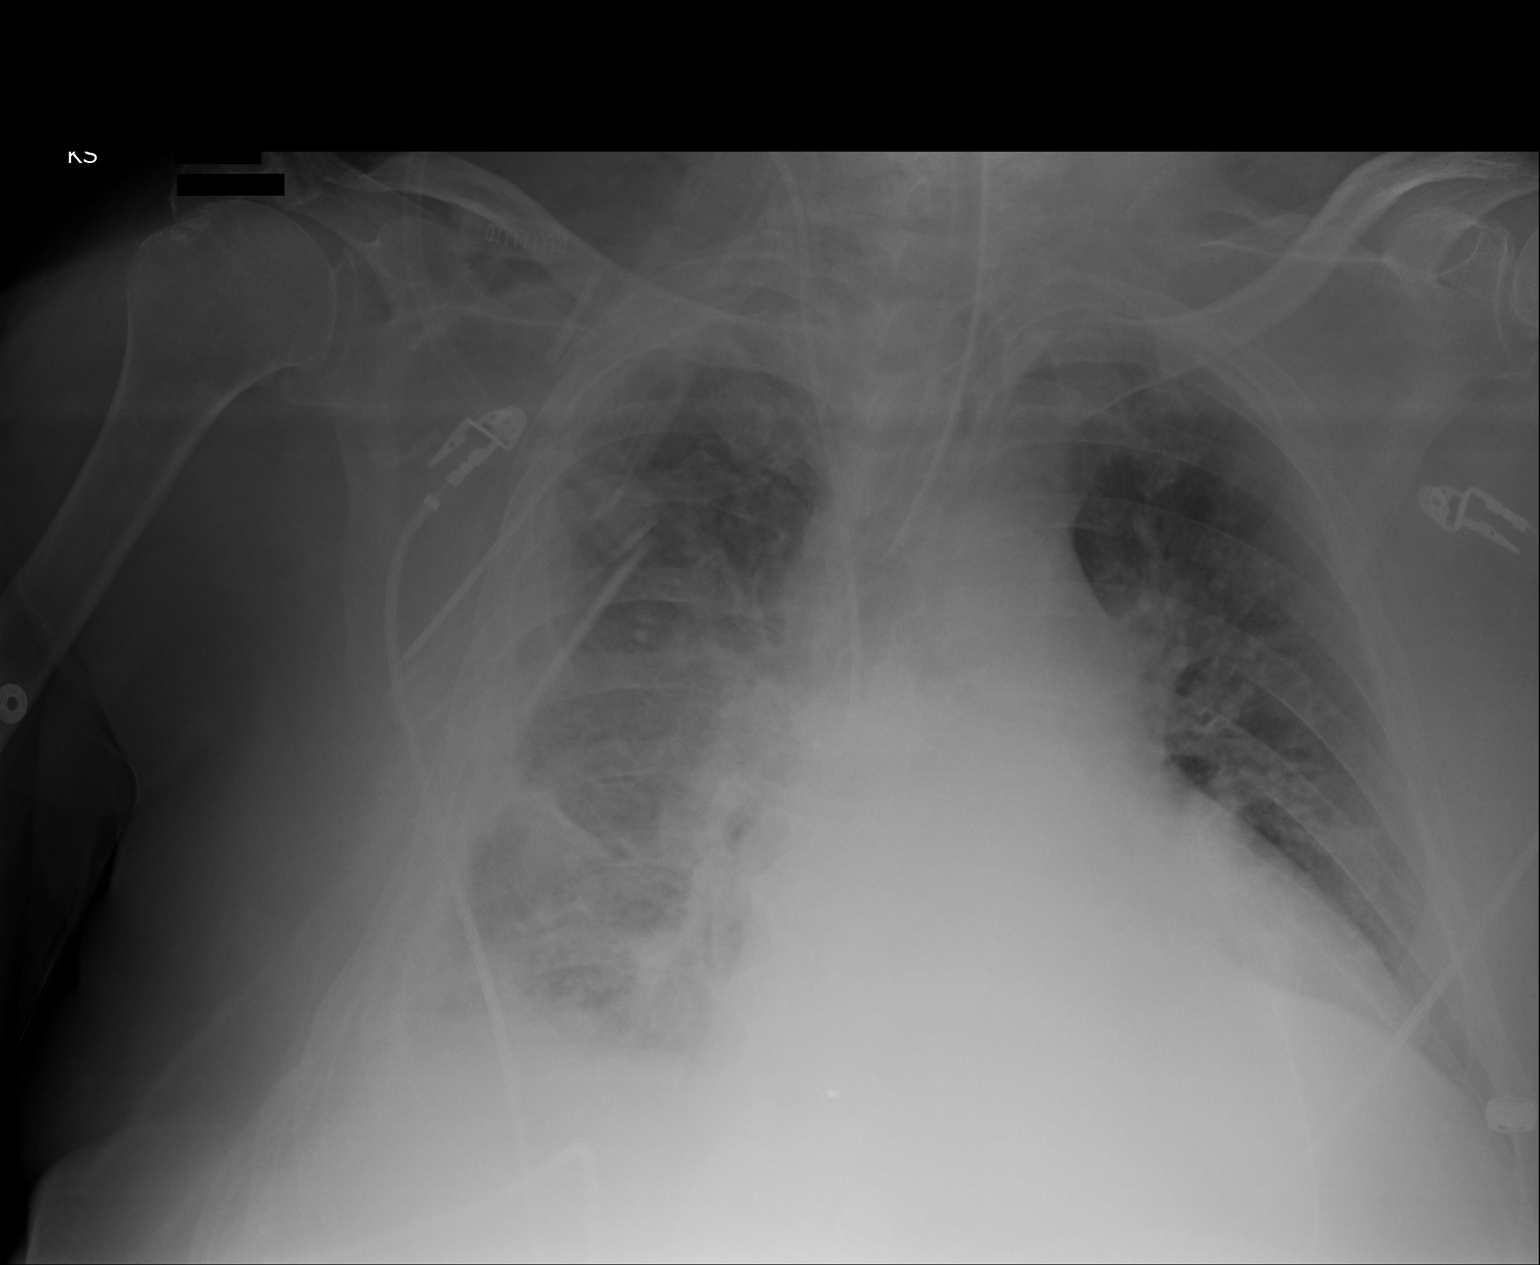

[1 of 1 positions shown; findings below may reference images not displayed]

PROCEDURE:     DXR - DXR PORTABLE CHEST SINGLE VIEW  - August 29, 2011  [DATE]

RESULT:     Comparison is made to prior study dated 01/27/2011.

There is thickening of the interstitial markings and peribronchial cuffing.
Areas of increased density project within the right hemithorax. There is
asymmetric density in the right lung base. Endotracheal tube is appreciated
at the level of the clavicles. A right-sided central venous catheter is
identified with tip projecting in the region of the superior vena cava. The
cardiac silhouette is enlarged. The visualized bony skeleton is grossly
unremarkable.
IMPRESSION: 1. Interstitial infiltrate likely representing pulmonary edema.
2. Support lines and tubes as described above
3. Cardiomegaly.
4. An underlying infectious or inflammatory infiltrate cannot be excluded.
Continued surveillance evaluation is recommended.

## 2013-07-16 IMAGING — US US RENAL KIDNEY
1 series · 17 of 25 positions shown · non-contrast
Comparison: none

REASON FOR EXAM: acute renal failure, ckd stage 3
COMMENTS:

[Series 1: us renal kidney · 17 of 38 slices shown]
[im 1/38]
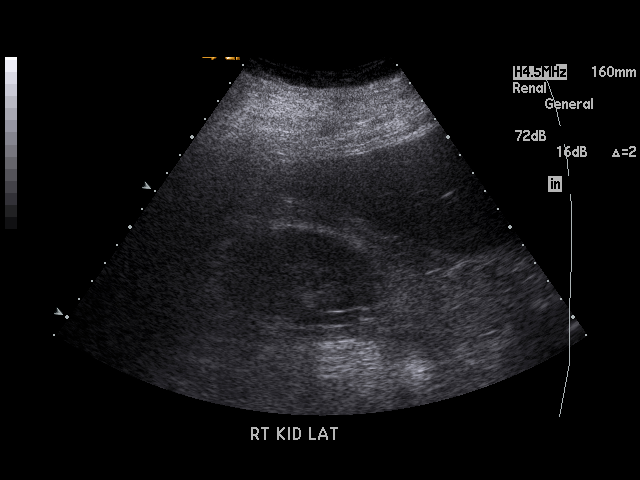
[im 4/38]
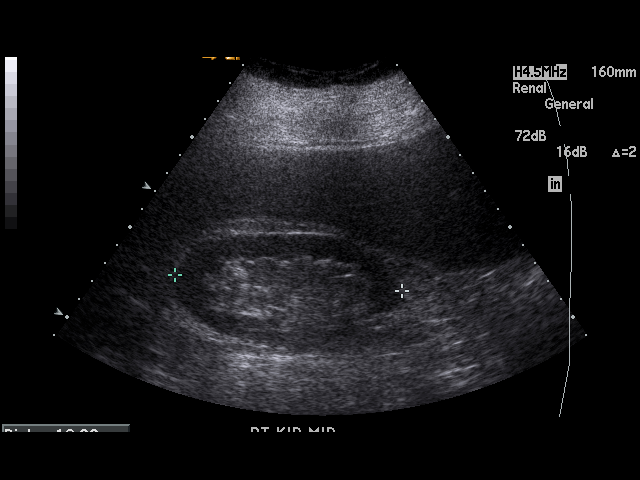
[im 5/38]
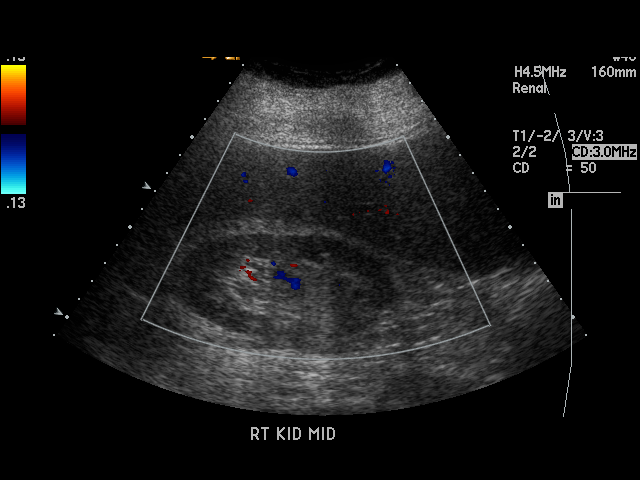
[im 8/38]
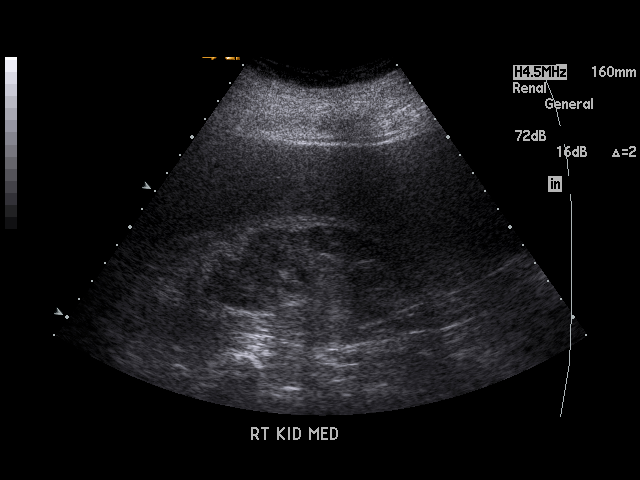
[im 10/38]
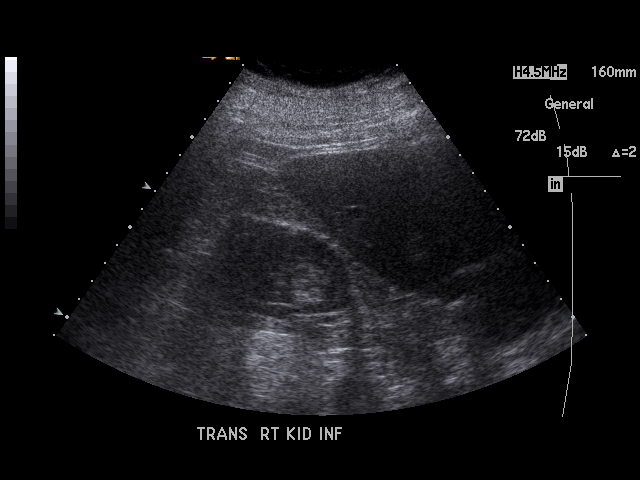
[im 13/38]
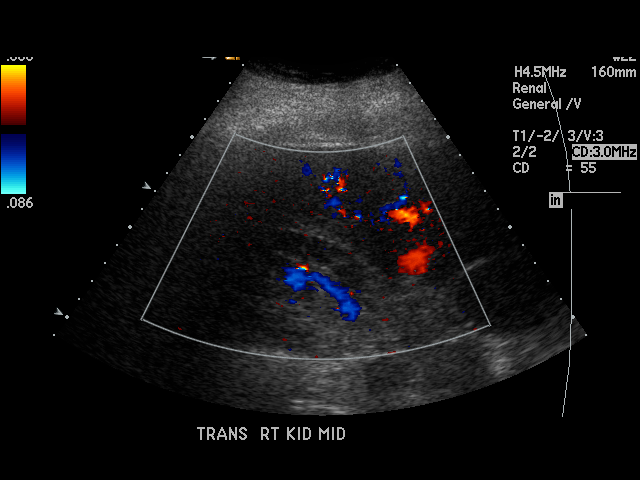
[im 14/38]
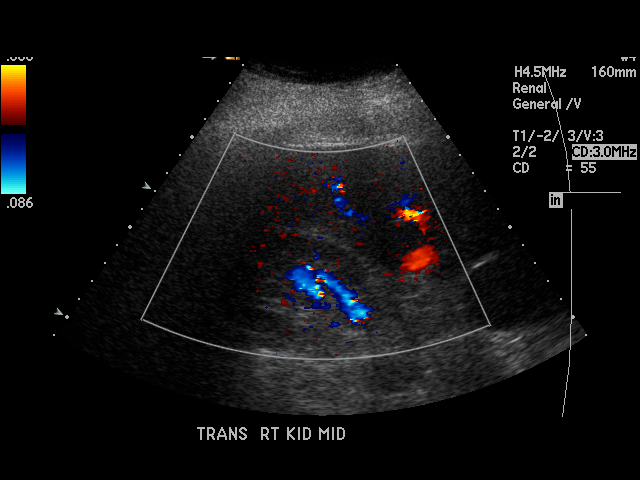
[im 17/38]
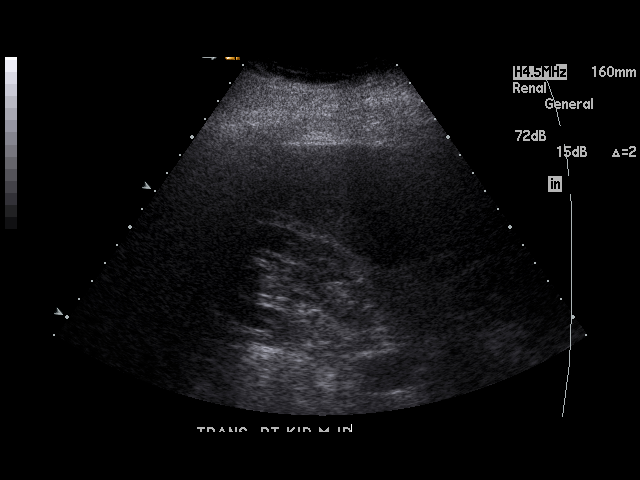
[im 19/38]
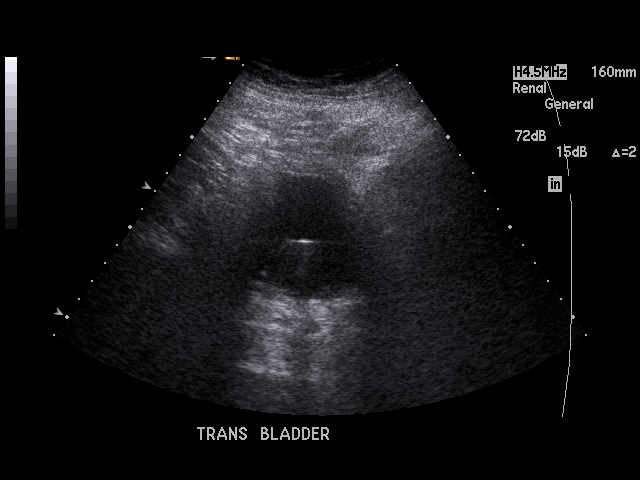
[im 21/38]
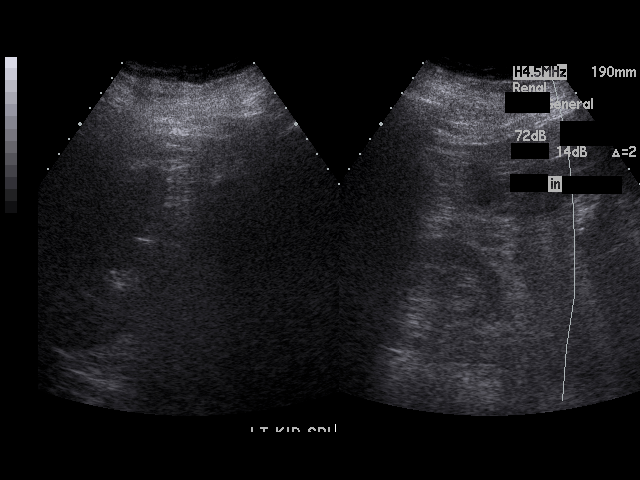
[im 24/38]
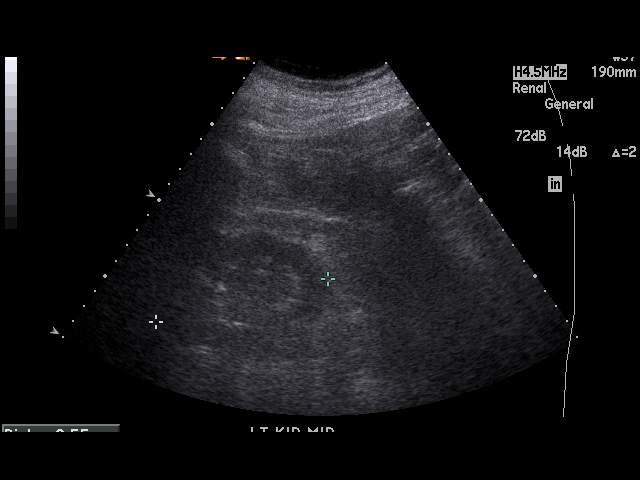
[im 25/38]
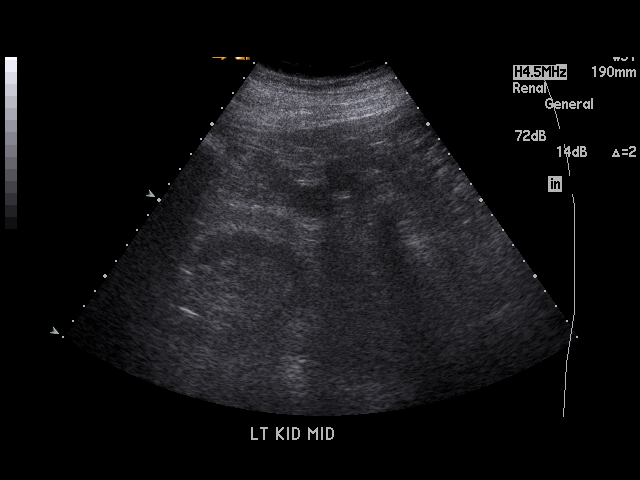
[im 28/38]
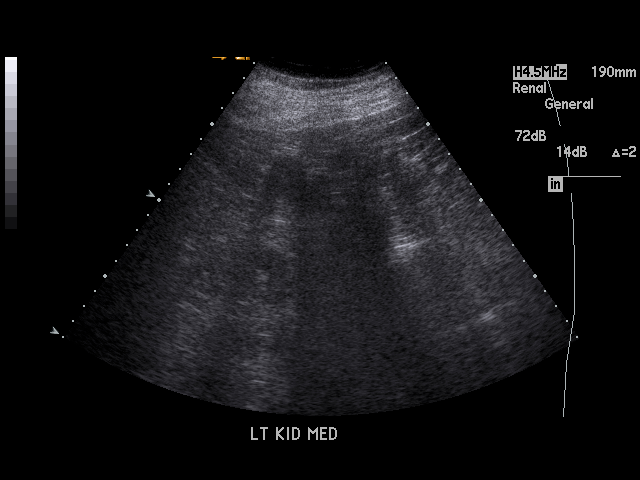
[im 30/38]
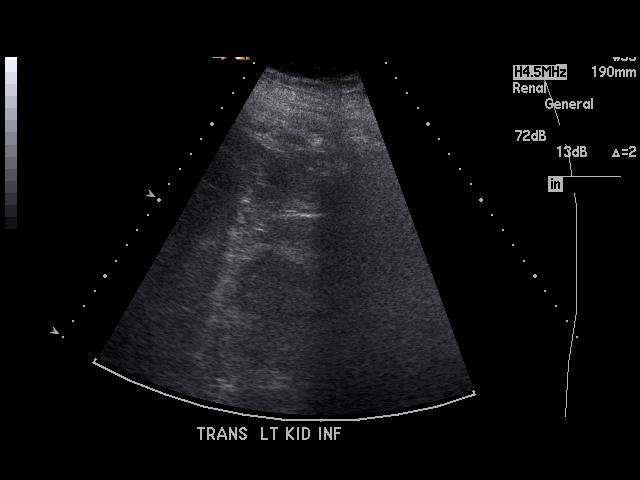
[im 33/38]
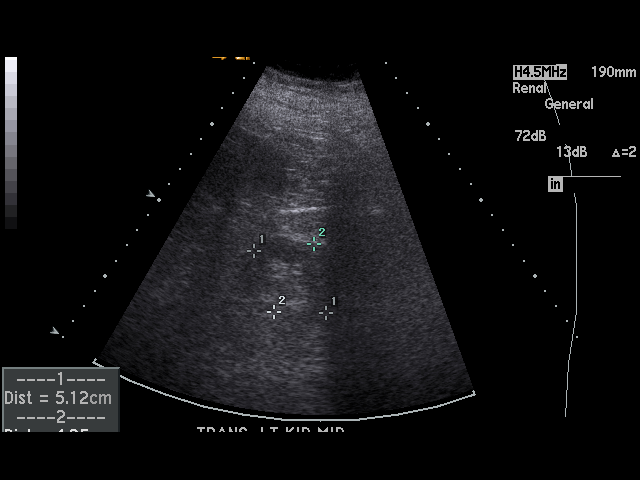
[im 34/38]
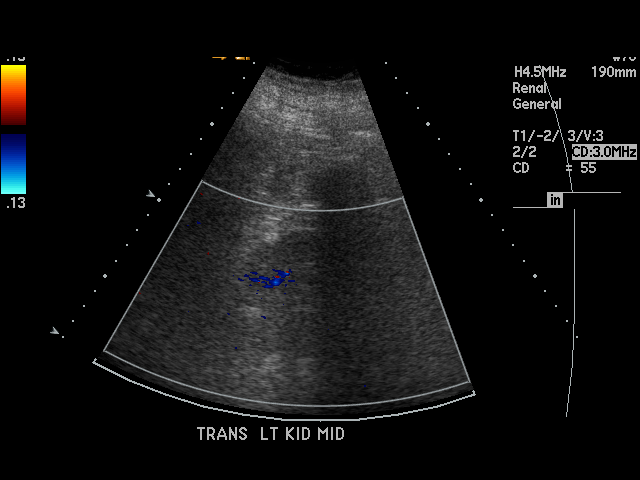
[im 38/38]
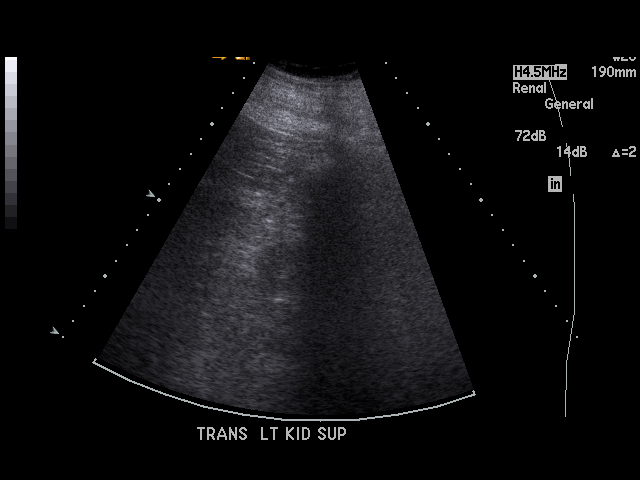

[17 of 25 positions shown; findings below may reference images not displayed]

PROCEDURE:     US  - US KIDNEY  - September 02, 2011 [DATE]

RESULT:

The right kidney measures 10.39 cm x 5.02 cm x 6.3 cm and the left kidney
measures 9.55 cm x 4.12 cm x 4.25 cm. The renal cortical margins appear
smooth. No renal mass is seen. No renal calcifications are observed. There
is no hydronephrosis. The urinary bladder is nearly empty. A Foley catheter
reportedly is present.
IMPRESSION: No hydronephrosis or other acute change is identified.

## 2013-07-19 IMAGING — CR DG CHEST 1V PORT
1 series · 1 of 1 positions shown · non-contrast
Comparison: none

REASON FOR EXAM: vent
COMMENTS:

PROCEDURE:     DXR - DXR PORTABLE CHEST SINGLE VIEW  - September 05, 2011  [DATE]
RESULT:     Comparison is made to a prior study dated 09/03/2011.

[portable]
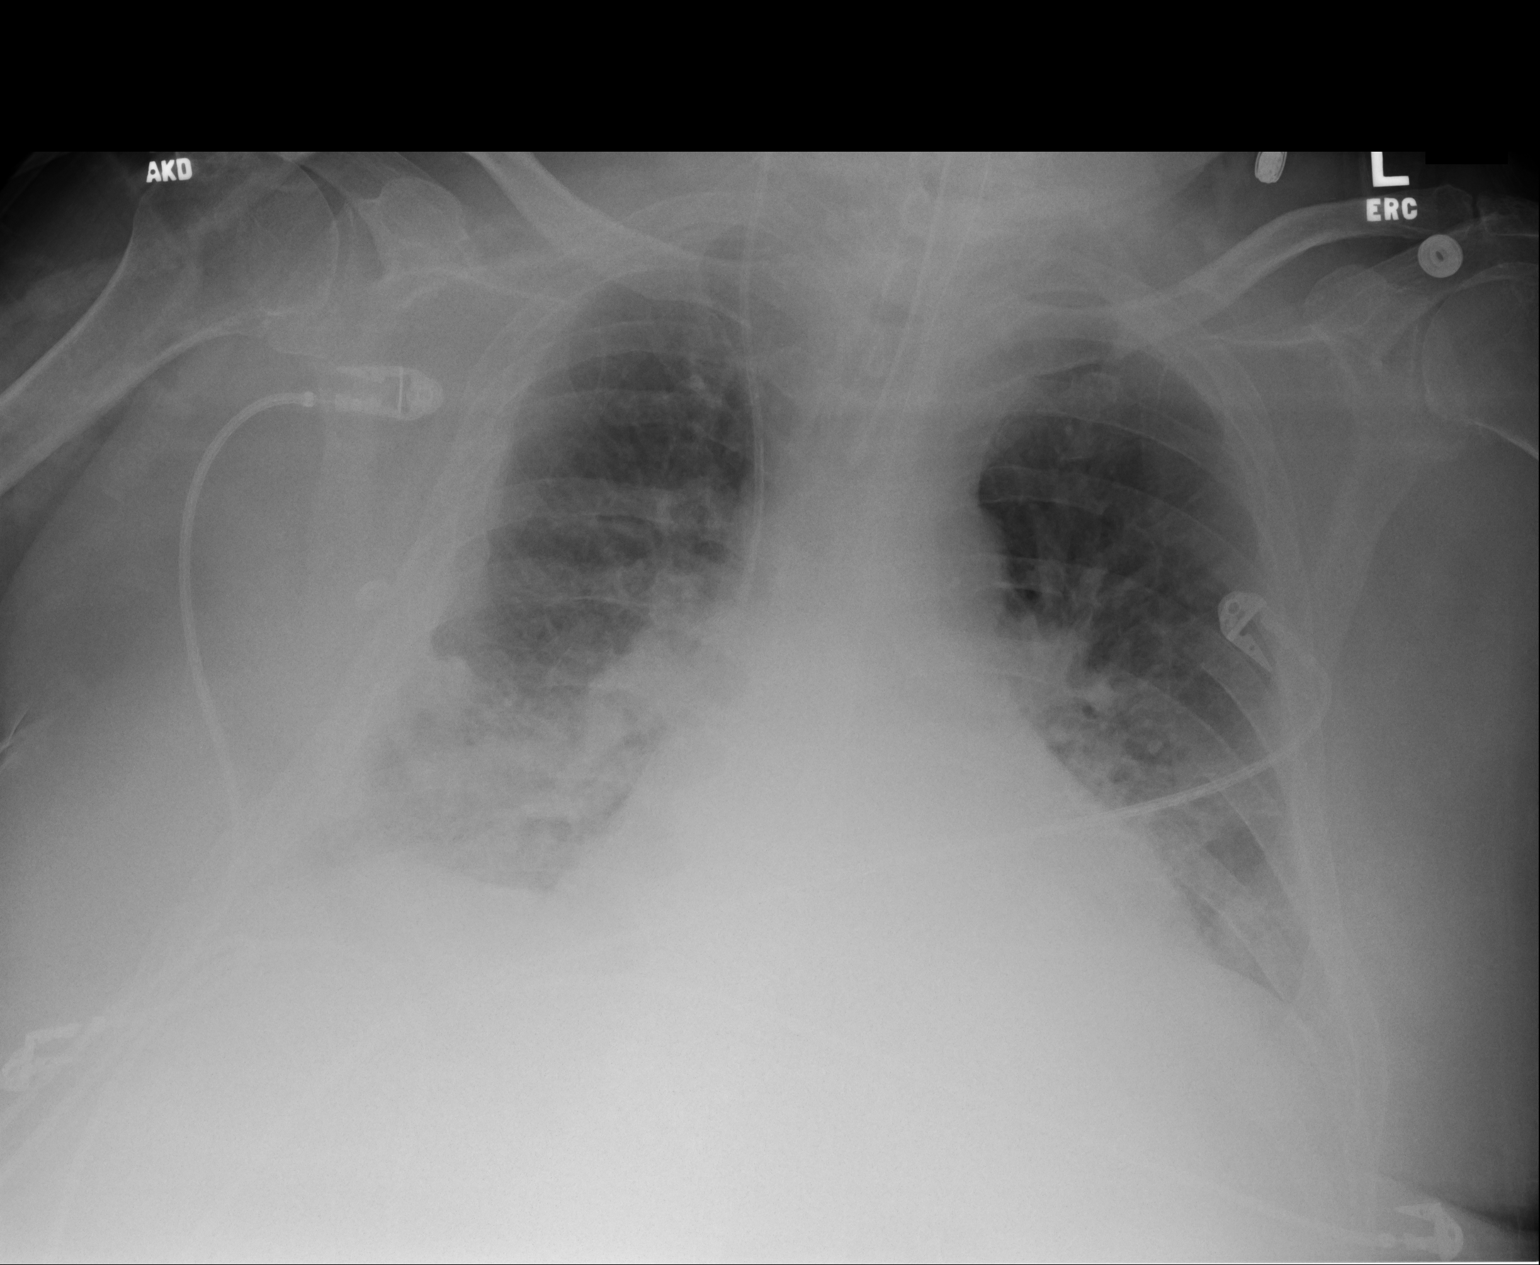

[1 of 1 positions shown; findings below may reference images not displayed]

FINDINGS: Endotracheal tube is appreciated with the tip at the level of the
clavicles. A right-sided central venous catheter is appreciated with the tip
at the level of the superior vena cava. There is thickening of the
interstitial markings and peribronchial cuffing. Areas of increased density
project within the right and left lower lobe regions. The cardiac silhouette
is enlarged indicative of cardiomegaly. The visualized bony skeleton is
unremarkable.
IMPRESSION: 1. Shallow inspiration.
2. Atelectasis versus infiltrate within the lung bases as well as last
underlying component of interstitial infiltrate edematous versus infectious
or inflammatory. Continued surveillance evaluation recommended.
3. Support lines and tubes as described above.

## 2013-07-20 IMAGING — CR DG CHEST 1V PORT
1 series · 1 of 1 positions shown · non-contrast
Comparison: none

REASON FOR EXAM: vent
COMMENTS:

[portable]
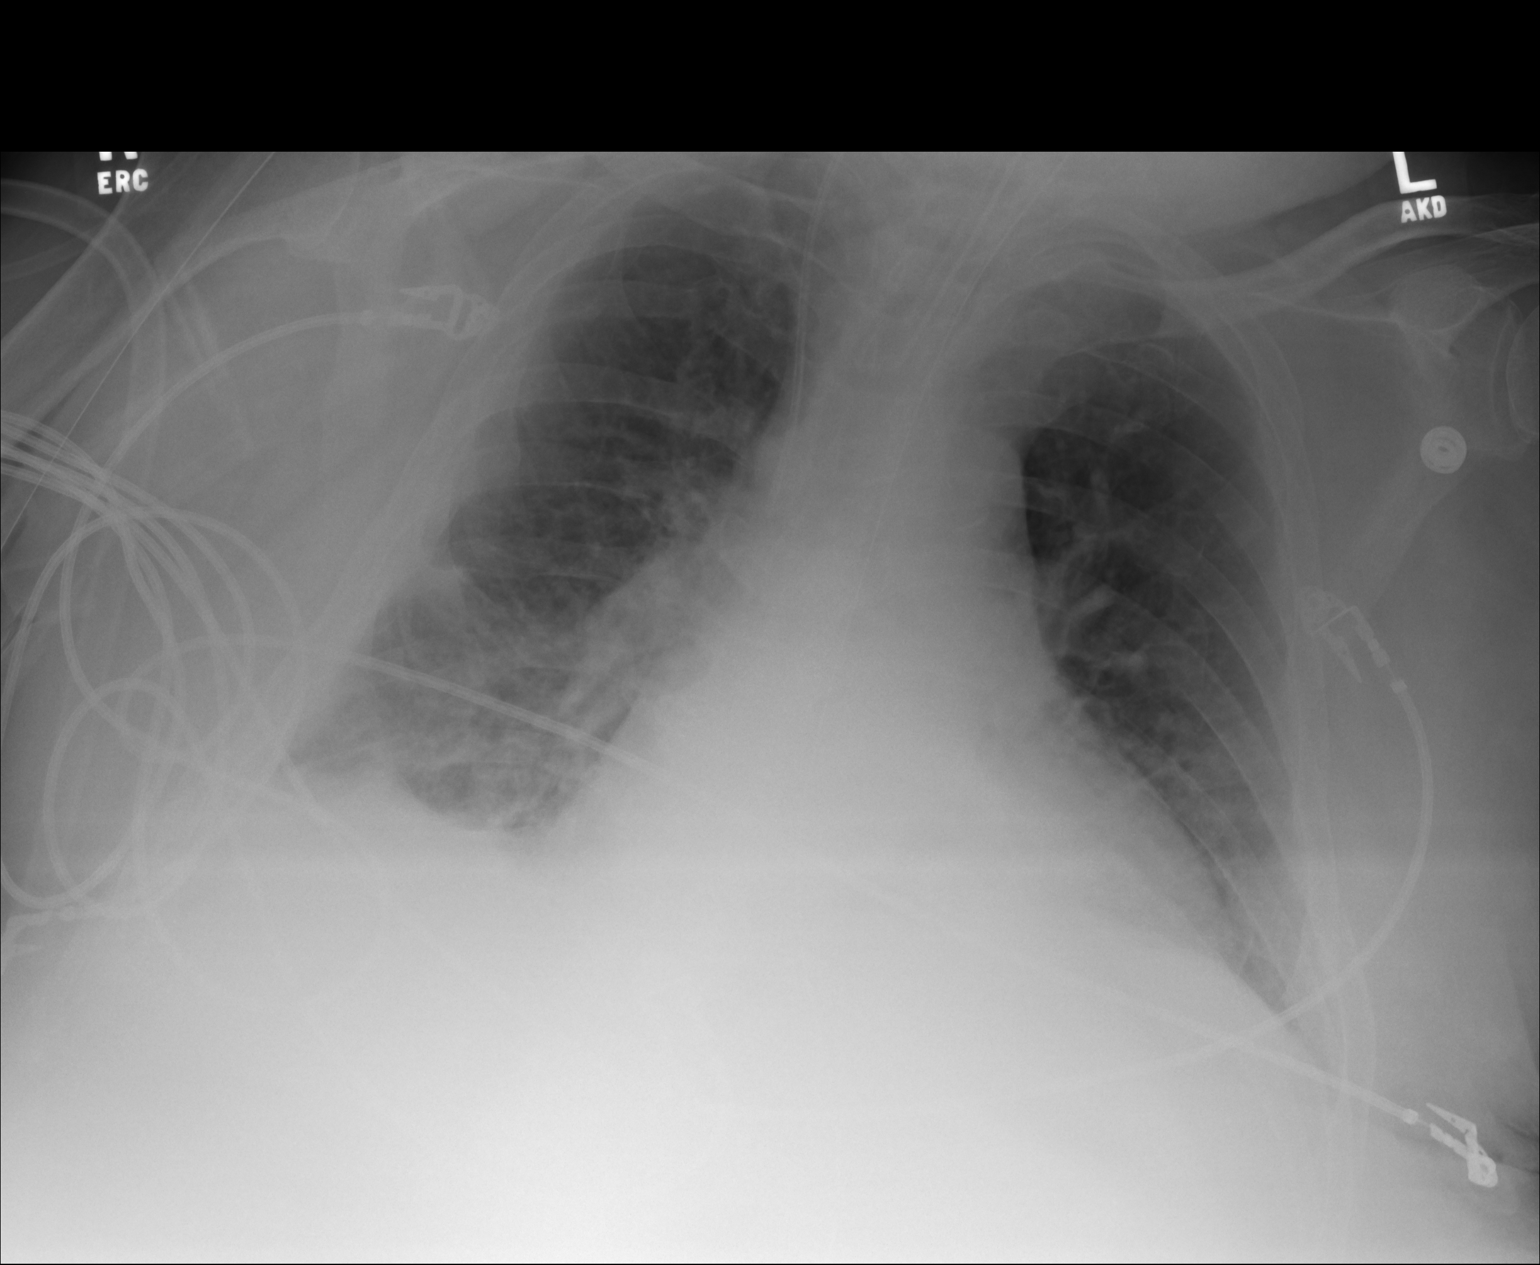

[1 of 1 positions shown; findings below may reference images not displayed]

PROCEDURE:     DXR - DXR PORTABLE CHEST SINGLE VIEW  - September 06, 2011  [DATE]

RESULT:

Frontal view of the chest is performed. Comparison is made to a prior study
dated 09/05/2011.

A right sided central venous catheter is appreciated with the tip projecting
in the region of the superior vena cava. Endotracheal tube is identified
with the tip at the level of the clavicles. NG tube is seen with the tip not
on the view of this study.

The patient has taken a shallow inspiration. There is thickening of the
interstitial markings and bronchial cuffing. Area of increased density
projects in the right lung base. There is blunting of the costophrenic
angle. The cardiac silhouette is enlarged indicative of cardiomegaly. The
visualized bony skeleton is unremarkable
IMPRESSION: 1.  Interstitial infiltrate likely representing edema. There is decreased
conspicuity of the findings within the lung bases.
2.  Support lines and tubes as described above.
3.  Surveillance evaluation is recommended.

## 2013-07-27 IMAGING — CR DG CHEST 1V PORT
1 series · 1 of 1 positions shown · non-contrast
Comparison: none

REASON FOR EXAM: difficulty breathing
COMMENTS:

PROCEDURE:     DXR - DXR PORTABLE CHEST SINGLE VIEW  - September 13, 2011  [DATE]
RESULT:     Comparison: 09/12/2011 , 09/09/2011

[ap]
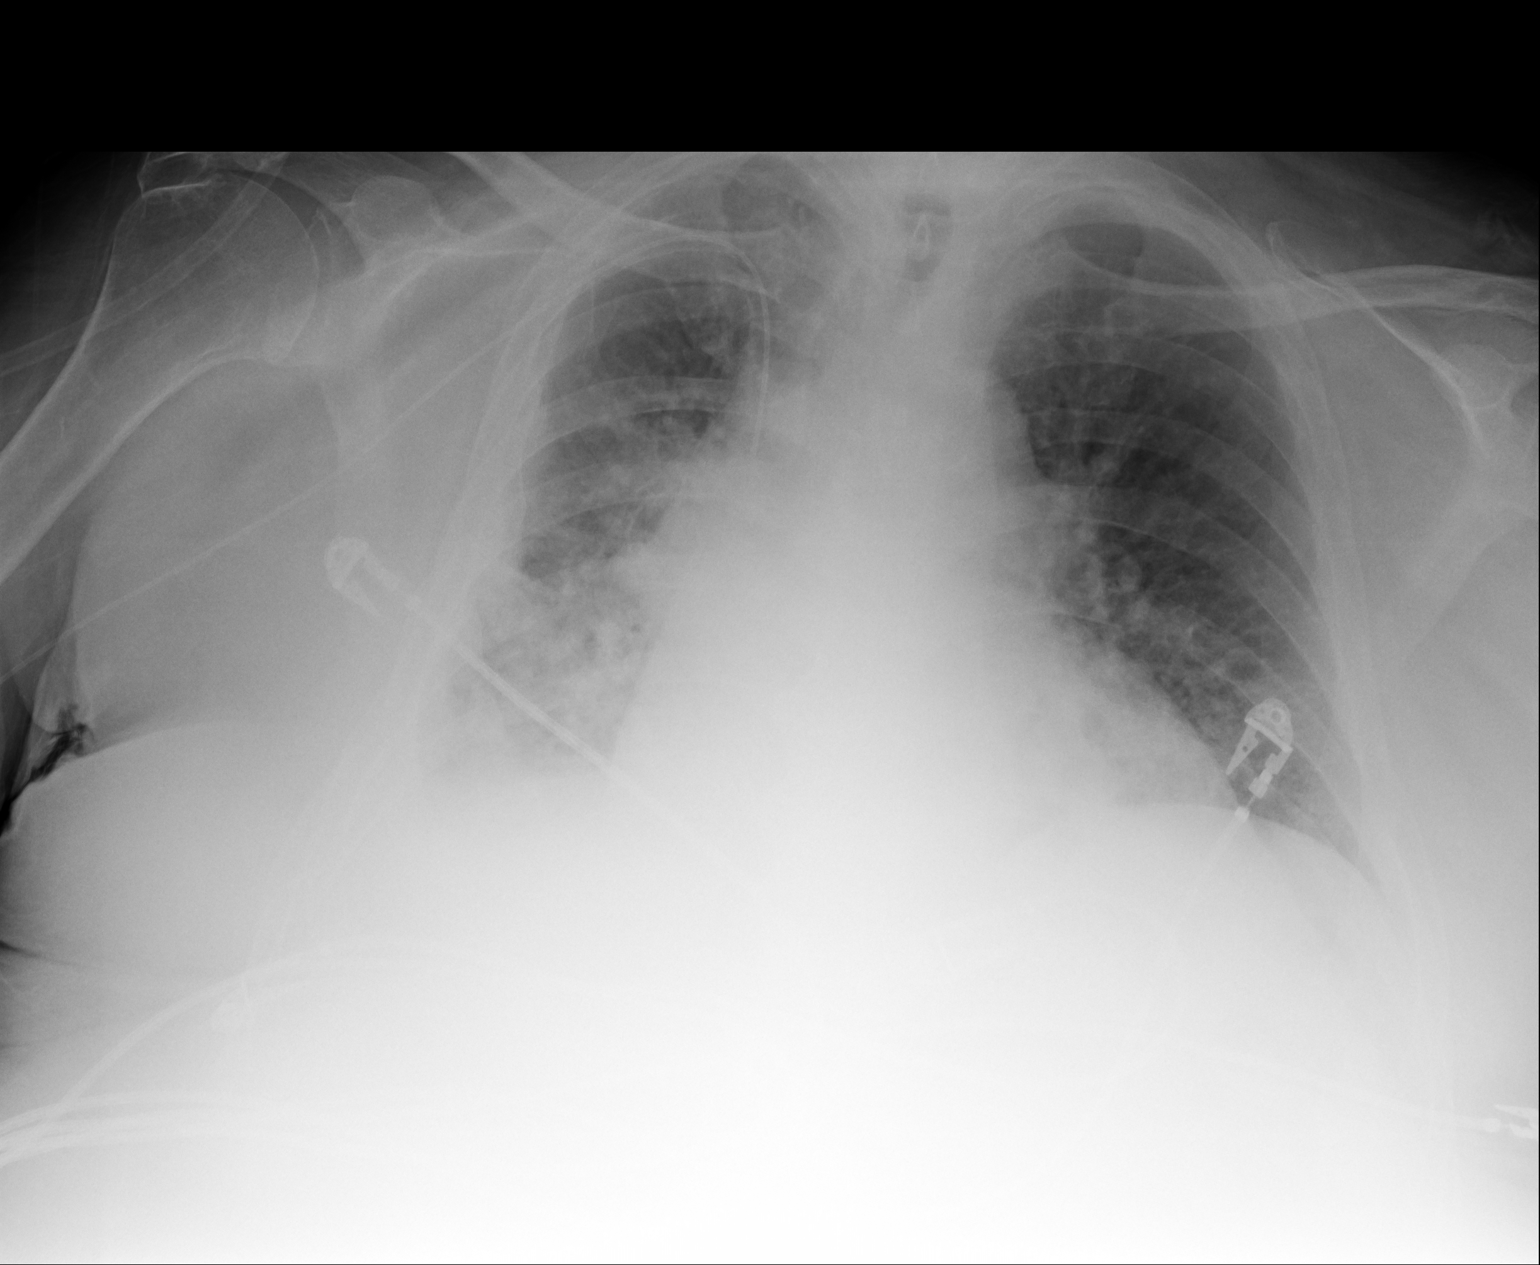

[1 of 1 positions shown; findings below may reference images not displayed]

FINDINGS: Single portable AP chest radiograph is provided. There is a right-sided PICC
line with the tip projecting over the SVC. There is bilateral diffuse
interstitial thickening likely representing interstitial edema versus
interstitial pneumonitis secondary to an infectious or inflammatory
etiology. There is a trace right pleural effusion. There is more confluent
right lower lobe airspace disease.. Stable heart and mediastinum. The
osseous structures are unremarkable.
IMPRESSION: There is bilateral diffuse interstitial thickening likely representing
interstitial edema versus interstitial pneumonitis secondary to an
infectious or inflammatory etiology.
There is more confluent right lower lobe airspace series concerning for
pneumonia which appears increased compared with 09/12/2011.

## 2014-09-08 NOTE — Op Note (Signed)
PATIENT NAME:  William BaasWARD, Lorne W MR#:  [id number] DATE OF BIRTH:  November 15, 1935  DATE OF PROCEDURE:  08/26/2011  PREOPERATIVE DIAGNOSIS: Osteomyelitis, right heel.   POSTOPERATIVE DIAGNOSIS: Osteomyelitis, right heel.   PROCEDURE: Partial calcanectomy right heel.   SURGEON: Rhona RaiderMatthew G. Nalia Honeycutt, DPM  ASSISTANT: None.   HISTORY OF PRESENT ILLNESS: The patient has had pain and discomfort to the right heel along with drainage and redness to the area for a number of months. We have been treating him with antibiotic graft, had been treating the wound with Wound VAC but it's continued to cause problems. Recent MRI showed some increased edema at the posterior heel consistent with osteomyelitis to the region.   ANESTHESIA: General.   NURSE ANESTHETIST: Ray DeMyer    ESTIMATED BLOOD LOSS: Approximately 30 mL.   HEMOSTASIS: Thigh tourniquet 325 mmHg pressure.   OPERATIVE REPORT: The patient was brought to the OR and placed on the OR table in the supine position. At this point after general anesthesia was achieved, attention was directed to the posterior heel. The area was incised proximally and distally along the longitudinal axis of the heel deepened with sharp and blunt dissection. The heel was exposed and a portion that was draining a little bit was cultured. This was sent for culture to the lab. Some of the tendon also appeared to be slightly infected. This was debrided with a Versajet and rongeurs. At this point, the posterior aspect of the heel was resected with power equipment and with osteotome. Once this was rounded and smoothed nicely, the area was checked. No further evidence of osteomyelitis was noted in the region. The area was then copiously irrigated and 95% primary closure was achieved utilizing 3-0 nylon simple interrupted and horizontal mattress combination. He tolerated everything fairly well. We put a sterile compressive dressing on him following a Marcaine block with Xeroform gauze, 4 x 4's,  Kling, and Kerlix along with an ABD pad. Tourniquet had been released much earlier and vascularity was seen to return to the digit although he does have peripheral arterial disease. Posterior splint was placed on the right foot and leg to stabilize the incision margin. He is an inpatient and I will continue to follow him.   ____________________________ Rhona RaiderMatthew G. Eladia Frame, DPM mgt:drc D: 08/26/2011 12:18:05 ET T: 08/26/2011 12:46:20 ET JOB#: [id number]  cc: Rhona RaiderMatthew G. Tanay Misuraca, DPM, <Dictator> Epimenio SarinMATTHEW G Jayceon Troy MD ELECTRONICALLY SIGNED 10/04/2011 10:40

## 2014-09-08 NOTE — Discharge Summary (Signed)
PATIENT NAME:  William Orozco, William Orozco MR#:  [id number] DATE OF BIRTH:  1935/08/29  DATE OF ADMISSION:  08/25/2011 DATE OF DISCHARGE:  09/21/2011   This is the final discharge summary in addendum to the interim discharge summary dictated by Dr. Ether Griffins on 04/30. I saw the patient from 05/04 to 05/07.   DISCHARGE MEDICATIONS:  1. Ergocalciferol 50,000 units p.o. q. Monday.  2. Colace 100 mg p.o. daily p.r.n.  3. Lasix 20 mg daily p.r.n. only for leg swelling.  4. Plavix 75 mg daily.  5. Nystatin cream t.i.d. to affected area as needed.  6. Milk of Magnesia 30 mL twice a day p.r.n.  7. Crestor 20 mg daily.  8. Roxicodone 5 to 10 mg q. 4-6 p.r.n.  9. Vasotec 2.5 mg daily, hold if SBP less than 120.  10. Carvedilol 12.5 mg b.i.d., hold if SBP less than 120.  11. Protonix 40 mg daily.  12. Nystatin powder to affected area b.i.d.  13. Epoetin alpha/Procrit injection 20,000 units subcutaneous weekly, received 09/21/2011 then q. weekly.  14. Tigecycline injection 50 mg IV b.i.d., last dose 10/16/2011.  15. Ambien 5 mg at bedtime p.r.n.  16. Insulin aspart 3 units 3 times daily before meals.  17. Insulin Lantus 7 units at bedtime.  18. Doxepin 25 mg at bedtime.  19. Celexa 20 mg daily.   DISCHARGE INSTRUCTIONS: 1. Physical therapy as tolerated.  2. Oxygen 2 liters per minute as needed only.  3. PICC line care per protocol.  4. Wound VAC care per protocol.    FOLLOWUP: 1. Follow up with Dr. Juleen China of nephrology 10/05/2011 at 10:00 a.m.  2. Follow up with Dr. Elvina Mattes of podiatry 09/27/2011 at 11:15 a.m.   DIET: Mechanical soft diet. No straws. 2000 ADA.   LABS AT DISCHARGE: White count 6.5, hemoglobin and hematocrit 9.3 and 28.3, platelet count 233. ESR is 25. Creatinine is 1.65. C. difficile negative.   BRIEF SUMMARY:  William Orozco is a 79 year old Caucasian gentleman who has had a prolonged hospital stay, admitted with:  1. Right calcaneus osteomyelitis status post debridement. Wound healing  well per Dr. Elvina Mattes. A wound VAC has been placed that was changed on 05/06. The patient will follow up with Dr. Elvina Mattes on 05/13 as above. He will need six weeks total of antibiotics up to 06/01. The patient has already had a PICC line placed. He will continue IV Tygacil as above.  2. Acute hypoxic respiratory failure due to pulmonary edema and right-sided aspiration pneumonia along with possible sleep apnea: The patient was intubated and extubated 09/08/2011, now on room air, sats 96%.  He finished up a seven-day course of IV Levaquin. He will continue p.r.n. oxygen at the nursing home if needed. The patient has been off Lasix in the setting of worsening renal function. Will give p.r.n. at the skilled facility if needed.  3. Acute on chronic congestive heart failure, systolic, with ejection fraction of 25 to 30% with acute pulmonary edema:  He is on low-dose enalapril and Coreg with holding parameters off Lasix. Will use as needed.  4. Elevated troponin with sinus bradycardia: Likely demand ischemia in the setting of multiple medical problems including heart failure.  5. Insulin-dependent diabetes: Hypoglycemic episodes were noted. Hence the patient is now on Lantus 7 units and aspart 3 units 3 times daily before meals,  which is controlling sugars fairly better.  6. Acute on chronic kidney disease: Overall better. Creatinine is 1.65 off Lasix. The patient appears euvolemic.  7.  Anemia of chronic disease: Hemoglobin was 8.8. The patient was started on q. weekly EPO, will follow up with Dr. Juleen China at the above set appointment. Hemoglobin on discharge is 9.3.   The patient is medically much improved, at baseline, and is going to be discharged to [facility] for further rehab and management.     Please note Dr. Keenan Bachelor interim discharge summary as well for further details on the previous hospital stay.    TIME SPENT: 40 minutes.  ____________________________ Hart Rochester Posey Pronto, MD sap:bjt D: 09/21/2011  10:17:46 ET T: 09/21/2011 12:01:29 ET JOB#: [id number]  cc: Garyn Waguespack A. Posey Pronto, MD, <Dictator> Mamie Levers, MD Gerrit Heck Troxler, DPM Ilda Basset MD ELECTRONICALLY SIGNED 09/26/2011 13:55

## 2014-09-08 NOTE — Consult Note (Signed)
PATIENT NAME:  OLIVIA, ROYSE MR#:  [id number] DATE OF BIRTH:  1935-07-06  DATE OF CONSULTATION:  08/25/2011  REFERRING PHYSICIAN:   CONSULTING PHYSICIAN:  Enid Baas, MD  ADMITTING PHYSICIAN: Recardo Evangelist, MD   PRIMARY MD: Dr. Hillery Aldo  REASON FOR CONSULTATION: Diabetes management.   BRIEF HISTORY: Mr. Mich is a 79 year old Caucasian male with past medical history significant for type II insulin dependent diabetes mellitus, chronic stasis dermatitis, peripheral vascular disease, CKD stage IV, and hypertension who has had trouble with right foot infections in the past requiring surgery who was admitted by Dr. Orland Jarred for possible osteomyelitis of right heel calcanea that might need surgery. The patient has had prior ankle fracture on the same foot and he was at [facility] doing rehab and had to bear weight resulting in a blister on the back of the right heel which eventually ulcerated and recent MRI shows osteomyelitis. The patient is currently on Zosyn, afebrile and pain free at this time.   PAST MEDICAL HISTORY:  1. Diabetes mellitus, insulin dependent.  2. Chronic stasis dermatitis.  3. Insomnia.  4. Peptic ulcer disease.  5. Anemia.  6. CKD, stage IV.  7. Peripheral vascular disease.  8. Hypertension.  9. Hyperlipidemia.   PAST SURGICAL HISTORY:  1. Venous insufficiency surgery on right leg in 2001.  2. Cataract surgery.   ALLERGIES TO MEDICATIONS: Aspirin and Augmentin.   HOME MEDICATIONS:  1. Tramadol 50 mg q.6 hours p.r.n.  2. NovoLog 70/30 insulin 18 units q.a.m. and 14 units with supper.  3. Lasix 40 mg p.o. daily.  4. Vitamin D3 daily.  5. Norvasc 10 mg p.o. daily.  6. Crestor 20 mg p.o. daily.  7. Colace 100 mg p.o. b.i.d.  8. Celexa 20 mg p.o. daily.  9. Doxepin 25 mg p.o. at bedtime.  10. Plavix 75 mg p.o. daily.  11. Protonix 40 mg p.o. daily.  12. Metoprolol 25 mg daily at bedtime.   SOCIAL HISTORY: He lives at home by himself. No smoking or  alcohol use.   FAMILY HISTORY: Father died from cancer. Mom had coronary artery disease. Brother with coronary artery disease and bypass graft surgery.   REVIEW OF SYSTEMS: CONSTITUTIONAL: No fever, fatigue, or weakness. EYES: Positive for blurred vision. Uses glasses. Also had cataract surgery. ENT: No tinnitus, ear pain, hearing loss, epistaxis, or discharge. RESPIRATORY: No cough, wheeze, hemoptysis, or COPD. CARDIOVASCULAR: No chest pain, orthopnea, edema, arrhythmia, palpitations, or syncope. GI: No nausea, vomiting, diarrhea, abdominal pain, hematemesis, or melena. GU: Increased frequency of urination on Lasix. No hematuria or renal calculus. ENDOCRINE: No polyuria, nocturia, thyroid problems, heat or cold intolerance. HEMATOLOGY: No anemia, easy bruising or bleeding. SKIN: No acne, rash, or lesions. MUSCULOSKELETAL: Positive for low back pain and also right leg pain. NEUROLOGIC: No numbness, weakness, CVA, or TIA. PSYCHOLOGIC: No anxiety, insomnia, or depression.   PHYSICAL EXAMINATION:   VITAL SIGNS: Temperature 99.7 degrees Fahrenheit, pulse 66, respirations 20, blood pressure 135/69, pulse oximetry 96% on room air.   GENERAL: Well built, well nourished male lying in bed not in any acute distress.   HEENT: Normocephalic, atraumatic. Pupils equal, round, reacting to light. Anicteric sclerae. Extraocular movements intact. Oropharynx clear without erythema, mass, or exudates.   NECK: Supple. No thyromegaly, JVD, or carotid bruits. No lymphadenopathy.   LUNGS: Clear to auscultation bilaterally. No wheeze or crackles. Decreased bibasilar breath sounds. No use of accessory muscles for breathing.   CARDIOVASCULAR: S1, S2. 3/6 systolic murmur in the mitral area.  No rubs or gallops.   ABDOMEN: Soft, nontender, nondistended. No hepatosplenomegaly. Normal bowel sounds.   EXTREMITIES: Has 1+ pedal edema. Both of them are wrapped in Ace wrapping. Per Dr. Racheal Patchesroxler's note, on right heel there is a 4 x  5 cm ulcer with good granular base. Bone is exposed.    SKIN: No acne, rash, or lesions.   NEUROLOGIC: Cranial nerves intact. No focal motor or sensory deficits.   PSYCHOLOGICAL: The patient is awake, alert, oriented x3.   LABORATORY, DIAGNOSTIC, AND RADIOLOGICAL DATA: WBC 6.8, hemoglobin 9.7, hematocrit 29.6, platelet count 185, sodium 139, potassium 3.9, chloride 103, bicarb 29, BUN 42, creatinine 2.84, glucose 115, calcium 8.7.  MRI of the ankle without contrast showing Achilles tendinosis with probable partial tear at its insertion and deep ulcer of the posterior calcaneus. Mild edema present suggesting osteomyelitis. Subacute fracture of distal fibula. Arthritis changes are present and distal flexor digitorum longus tenosynovitis is present.   EKG is pending.  ASSESSMENT AND RECOMMENDATIONS This is a 79 year old male with past medical history of hypertension, diabetes, diabetic foot infection status post right ankle surgery in the past, CKD stage IV, and peripheral vascular disease admitted for right foot calcaneal osteomyelitis and possible surgery tomorrow. Medical consult was requested for medical management.  1. Insulin-dependent diabetes mellitus. Follow-up HbA1c on sliding scale insulin. Continue his NovoLog 70/30 b.i.d. at home dosage. Will hold tomorrow morning's dose as he will be n.p.o. for surgery.  2. Right calcaneal osteomyelitis. Management per Podiatry. Blood cultures were not sent. He is already started on Zosyn. Follow-up wound cultures after surgery tomorrow. In the past he had Proteus mirabilis growing in his wound.  3. Peripheral vascular disease. His Plavix is on hold currently for surgery tomorrow.  4. Hypertension. Continue Norvasc, Lasix, and metoprolol.  5. Depression. He is on Celexa and doxepin.  6. GI prophylaxis. He is on Protonix.   CODE STATUS: FULL CODE.   TIME SPENT ON CONSULTATION: 50 minutes.  ____________________________ Enid Baasadhika Keiji Melland,  MD rk:drc D: 08/25/2011 20:47:58 ET T: 08/26/2011 11:00:05 ET JOB#: [id number]  cc: Enid Baasadhika Chrisann Melaragno, MD, <Dictator> Enid BaasADHIKA Rebecka Oelkers MD ELECTRONICALLY SIGNED 08/27/2011 14:26

## 2014-09-08 NOTE — Op Note (Signed)
PATIENT NAME:  Enid BaasWARD, Raden W MR#:  [id number] DATE OF BIRTH:  12/14/1935  DATE OF PROCEDURE:  09/09/2011  PREOPERATIVE DIAGNOSIS: Osteomyelitis of the foot with need for long-term IV antibiotics.   POSTOPERATIVE DIAGNOSIS: Osteomyelitis of the foot with need for long-term IV antibiotics.  PROCEDURES:  1. Ultrasound guidance for vascular access to right basilic vein.   2. Fluoroscopic guidance for placement of catheter.  3. Insertion of peripherally inserted central venous catheter, right arm.  SURGEON: Annice NeedyJason S. Bay Wayson, MD   ANESTHESIA: Local.   ESTIMATED BLOOD LOSS: Minimal.   INDICATION FOR PROCEDURE: This is a 79 year old white male with osteomyelitis of the foot and nonreconstructable vascular disease. He is trying to have limb salvage with long-term IV antibiotics. PICC line is requested.   DESCRIPTION OF PROCEDURE: The patient's right arm was sterilely prepped and draped, and a sterile surgical field was created. The basilic vein was accessed under direct ultrasound guidance without difficulty with a micropuncture needle and permanent image was recorded. 0.018 wire was then placed into the superior vena cava. Peel-away sheath was placed over the wire. A single lumen peripherally inserted central venous catheter was then placed over the wire and the wire and peel-away sheath were removed. The catheter tip was placed into the superior vena cava and was secured at the skin at 39 cm with a sterile dressing. The catheter withdrew blood well and flushed easily with heparinized saline. The patient tolerated procedure well.  ____________________________ Annice NeedyJason S. Ronda Rajkumar, MD jsd:drc D: 09/09/2011 14:49:40 ET T: 09/09/2011 16:13:58 ET JOB#: [id number] Annice NeedyJASON S Dariusz Brase MD ELECTRONICALLY SIGNED 09/13/2011 16:37

## 2014-09-08 NOTE — Op Note (Signed)
PATIENT NAME:  William Orozco, William Orozco MR#:  [id number] DATE OF BIRTH:  10-24-1935  DATE OF PROCEDURE:  08/29/2011  PREOPERATIVE DIAGNOSIS: Respiratory insufficiency.   POSTOPERATIVE DIAGNOSES:  1. Respiratory insufficiency.  2. Failure of IV access.   PROCEDURE: Placement right internal jugular catheter using ultrasound guidance.   SURGEON: Quentin Orealph L. Ely III, MD  ANESTHESIA: Local and IV sedation.   DESCRIPTION OF PROCEDURE: With the patient in supine position after induction of appropriate intravenous sedation, the patient's right neck was prepped with ChloraPrep and draped with sterile towels. The neck had previously been interrogated with the ultrasound. Jugular vein was identified. Using 1% Xylocaine for anesthesia, the vein was cannulated on a single pass and a wire passed into the right heart without difficulty. The needle was removed, the skin nicked and the dilator placed over the wire into the great vessel system. Dilator was removed and the catheter slid over the wire into the great vessel system. It was then secured with 3-0 silk and appropriately flushed and dressed. Chest x-ray is pending.   ____________________________ Carmie Endalph L. Ely III, MD rle:cms D: 08/29/2011 16:15:32 ET T: 08/30/2011 07:59:40 ET JOB#: [id number]  cc: Quentin Orealph L. Ely III, MD, <Dictator> Quentin OreALPH L ELY MD ELECTRONICALLY SIGNED 08/30/2011 10:40

## 2014-09-08 NOTE — Op Note (Signed)
PATIENT NAME:  William BaasWARD, Janssen W MR#:  [id number] DATE OF BIRTH:  1936/02/20  DATE OF PROCEDURE:  06/24/2011  PREOPERATIVE DIAGNOSIS: Diabetic ulcer, right heel, measuring 6 x 4 cm with 2 cm of depth.   POSTOPERATIVE DIAGNOSIS: Diabetic ulcer, right heel, measuring 6 x 4 cm with 2 cm of depth.  PROCEDURE: Debridement of right heel with Versajet and application of Wound VAC with silver impregnated sponge.   SURGEON: Rhona RaiderMatthew G. Traycen Goyer, DPM  ASSISTANT: None.   HISTORY OF PRESENT ILLNESS: The patient has had sort of a decubitus/diabetic ulcer on his right heel for a couple of month now. We have tried putting grafts on it early on though did not improve it a lot, ended up having some continued problems with the ulceration and gradual contamination of the superficial portion with mild infecting contaminant in the ulcer. No surrounding cellulitis.  ANESTHESIA: IV sedation with local anesthetic sedation by the anesthesia team, local by myself.  ESTIMATED BLOOD LOSS: 25 mL.   HEMOSTASIS: None.   OPERATIVE REPORT: The patient was brought to the OR and placed on the OR table in the supine position. At this point after sedation was achieved and local anesthesia was achieved by me, the patient was then prepped and draped in the usual sterile manner. The patient's leg was elevated and a Versajet set at level 7 was utilized to debride devitalized tissue and necrotic tissue from the superficial portion of the wound. There was one point centrally where there was almost a full thickness depth down to the Achilles tendon bone insertional region but it looked like it was fairly granular across that area. Some deep penetration of the damaged tissues were encountered medially as well. This was all debrided with the Versajet. This was accomplished until good capillary bleeding was seen to all these regions. At this point, dressing was placed onto to compress it and the Wound VAC sponge was prepared and the apparatus for  the Wound VAC was placed on the wound with silver GranuFoam dressing. Once we had a good airtight dressing on, we hooked the Wound VAC up, turned it on 125 of pressure continuous and it seemed to have a good suction and was working very well. The area was then dressed with some extra padding to avoid irritation from the suction tube and give him some extra protection. This was accomplished with 4 x 4's and Kerlix. The patient left the OR for the recovery room with vital signs stable and neurovascular status intact.   ____________________________ Rhona RaiderMatthew G. Wilhemina Grall, DPM mgt:drc D: 06/24/2011 11:16:57 ET T: 06/24/2011 11:51:06 ET JOB#: [id number]  cc: Rhona RaiderMatthew G. Leonardo Plaia, DPM, <Dictator> Epimenio SarinMATTHEW G Brysan Mcevoy MD ELECTRONICALLY SIGNED 07/01/2011 11:30

## 2014-09-08 NOTE — Consult Note (Signed)
Brief Consult Note: Patient was seen by consultant.   Comments: Difficult situation.  Given the recent events he is at an extremely high risk for any further surgery and the longer an amputation could be delayed the better.  Dr Orland Jarredroxler is pleased with the debridement and the appearance of the wound at this time and there is no indication for emergent amputation.  Therefore it would seem to be most prudent to follow for now and see if the wound does heal.  Electronic Signatures: Levora DredgeSchnier, Yilia Sacca (MD)  (Signed [phone] 528202-May-13 09:07)  Authored: Brief Consult Note   Last Updated: 02-May-13 09:07 by Levora DredgeSchnier, Remas Sobel (MD)

## 2014-09-08 NOTE — Consult Note (Signed)
Impression: 79yo WM w/ h/o DM, CRI, PVD admitted with chronic osteomyelitis who developed acute respiratory failure following vanco infusion.  Unclear if he had an anaphylactic reaction to the vanco.  This would be extremely rare after vancomycin, but the timing is concerning.  Other possibilities include CHF, vasovagal reaction or cardiac arrhythmia.  He remains bradycardic.  His CXR showed CHF.  He received steroids and benadryl after the event.  There is likely no way to rule out anaphylaxis to vanco.  Would consider him allergic to vanco. His heel culture is only growing Proteus and CNS.  The latter is likely a contaminant.  Will change him to ceftriaxone which will allow for once per day dosing. He will eventually need a PICC line for long term antibiotics. Will likely need 6 weeks of IV therapy.  Electronic Signatures: Lachlan Pelto, Rosalyn GessMichael E (MD)  (Signed on 15-Apr-13 16:28)  Authored  Last Updated: 15-Apr-13 16:28 by Nickia Boesen, Rosalyn GessMichael E (MD)

## 2014-09-08 NOTE — Consult Note (Signed)
Brief Consult Note: Diagnosis: DM, right heel osteomyelitis, CKD, HTN and PVD.   Patient was seen by consultant.   Consult note dictated.   Orders entered.   Comments: 79y/o M with PMH of  HTN, IDDM, diabetic foot infections s/p right foot suregery x 2 recently, PVD, CKD stage 4, admitted for right calcaneal osteomyelitis and possible surgery tomorrow medical consult requested for medical mgmt  * IDDM- hba1c, ssi, cont novolog 70/30 bid- 18 units QAM and 14units QHS. hold tomorrow am dose as NPO for suregry cont ssi  * Right calcaneal osteo- mgmt per podiatry, started on zosyn, blood cultures not sent, f/u wound cultures after surgery tomorrow  * PVD- plavix on hold for surgery  * HTN- norvasc, lasix and metoprolol  * depression - celexa and doxepin.  Electronic Signatures: Enid BaasKalisetti, Demetreus Lothamer (MD)  (Signed 10-Apr-13 20:05)  Authored: Brief Consult Note   Last Updated: 10-Apr-13 20:05 by Enid BaasKalisetti, Lubertha Leite (MD)

## 2014-09-08 NOTE — Consult Note (Signed)
PATIENT NAME:  Enid BaasWARD, Othell W MR#:  [id number] DATE OF BIRTH:  1935/08/15  DATE OF CONSULTATION:  08/30/2011  REFERRING PHYSICIAN:  Dr. Imogene Burnhen  CONSULTING PHYSICIAN:  Rosalyn GessMichael E. Kaiyu Mirabal, MD  REASON FOR CONSULTATION: Osteomyelitis and possible vancomycin reaction.   HISTORY OF PRESENT ILLNESS: The patient is a [age] year old white man with a past history significant for diabetes, chronic renal insufficiency, and peripheral vascular disease who was admitted on 04/10 with long-standing left heel ulcer with MRI findings consistent with osteomyelitis. The patient's sister indicates that he had had the ulcer for almost a year and had been treated as an outpatient.  She does not know if there was any initial trauma to the area.  He was admitted to the hospital and underwent surgical debridement on 04/11. He had a partial calcanectomy of the left heel.  The patient was initially treated with Zosyn. The cultures initially grew Proteus and a gram-positive cocci. He was given one dose of vancomycin and shortly thereafter became unresponsive. His sister said that he was having some shortness of breath prior to becoming unresponsive. He gotten up to go to the bathroom and fell and was unresponsive. He was given steroids and Benadryl and transferred to the Critical Care Unit. He has subsequently been intubated. His initial chest x-rays have shown evidence for congestive heart failure. His culture ultimately grew coagulase-negative staph. He continues on the Zosyn. The patient remains intubated and is unable to provide any history.   ALLERGIES: Aspirin, Neurontin, and likely vancomycin.   PAST MEDICAL HISTORY:  1. Diabetes.  2. Chronic renal insufficiency.  3. Osteomyelitis of the left heel.  4. Chronic stasis dermatitis.  5. Peptic ulcer disease.  6. Peripheral vascular disease.  7. Hypertension.  8. Hypercholesterolemia.   SOCIAL HISTORY: The patient lives in independent living by himself. He does not smoke. He  does not drink. He has no pets at home.   FAMILY HISTORY: Positive for lung cancer and coronary artery disease.   REVIEW OF SYSTEMS: Unable to obtain from the patient due to his current clinical condition.   PHYSICAL EXAMINATION:  VITAL SIGNS: T-max of 100.5, T-current 98.4, pulse 50, blood pressure 130/48, 97% on the ventilator. Vent settings include assist control at a rate of 16, spontaneous rate of zero, tidal volume of 565, minute? ventilation of 9, FiO2 of 30%, PEEP of 5.   GENERAL: [age] year old obese white man appearing critically ill.   HEENT: Normocephalic, atraumatic. Pupils were equal and round. Unable to assess extraocular motion. Sclerae, conjunctivae, and lids are without evidence for emboli or petechiae. The patient was orally intubated and further exam was not possible.   NECK: Midline trachea. No lymphadenopathy. No thyromegaly.   LUNGS: Clear to auscultation bilaterally with good air movement. No focal consolidation.   CARDIAC: Bradycardic, regular rhythm. No murmurs, rubs, or gallops.   ABDOMEN: Obese, soft, nondistended. No hepatosplenomegaly. No hernia is noted.   EXTREMITIES: His right foot was wrapped in a cast. The left foot was wrapped in ace bandages and the wound was not directly observed.    SKIN: No rashes. No stigmata of endocarditis, specifically no Janeway lesions or Osler nodes.   NEUROLOGIC: The patient is unresponsive on the ventilator.   PSYCHIATRIC: Unable to assess.   LABORATORY DATA: BUN 53, creatinine 2.4, bicarbonate 22, anion gap 12. LFTs are unremarkable. White count of 9.1 with a hemoglobin 8.7, platelet count 166, ANC 7.7. White count on admission was 6.8 and a wound culture from surgery is growing  Proteus mirabilis and coagulase-negative staph. A chest x-ray from yesterday likely representing pulmonary edema. Ultrasound of the lower extremities bilaterally showed no evidence for deep vein thrombosis. Chest x-ray from today showed pulmonary  edema that was slightly more prominent.   IMPRESSION: 79 year old white man with a past history significant for diabetes, chronic renal insufficiency, peripheral vascular disease, admitted with chronic osteomyelitis who developed acute respiratory failure following vancomycin infusion.   RECOMMENDATIONS:  1. Unclear if he had an anaphylactic reaction to vancomycin. This would be extremely rare after vancomycin but the timing is concerning for this. Other possibilities include congestive heart failure, vasovagal reaction, or cardiac arrhythmia. He remains bradycardic. His chest x-ray shows pulmonary edema. He received steroids and Benadryl after the event. There is likely no way to rule out anaphylaxis to vancomycin. We will consider him allergic to vancomycin. His heel culture is only growing Proteus and coagulase-negative staph. The latter is likely a contaminant.  2. We will change him to ceftriaxone which will allow for once-a-day dosing.  3. He will eventually need a PICC line for long-term antibiotics.  4. He will likely need six weeks of IV therapy.   This is a high-level infectious disease consultation.  Thank you very much for involving me in Mr. Deloatch care.   ____________________________ Rosalyn Gess. Junelle Hashemi, MD meb:bjt D: 08/30/2011 16:37:15 ET T: 08/30/2011 17:52:46 ET JOB#: [id number]  cc: Rosalyn Gess. Khayree Delellis, MD, <Dictator> Temple Sporer E Deeann Servidio MD ELECTRONICALLY SIGNED 09/01/2011 17:27

## 2014-09-08 NOTE — Consult Note (Signed)
PATIENT NAME:  William Orozco, [name] MR#:  [id number] DATE OF BIRTH:  20-Feb-1936  DATE OF CONSULTATION:  08/30/2011  REFERRING PHYSICIAN:   CONSULTING PHYSICIAN:  Laurier NancyShaukat A. Verbena Boeding, MD  INDICATION FOR CONSULTATION: Bradycardia and elevated troponin.   HISTORY OF PRESENT ILLNESS: This is a 79 year old white male who is intubated in the Intensive Care Unit, unable to communicate and give any history, but was admitted because of right heel osteomyelitis. He had sinus bradycardia and elevated troponin, thus I was asked to evaluate the patient.   PAST MEDICAL HISTORY: History of diabetes type 2, urinary frequency, chronic stasis dermatitis, insomnia, gastric ulcer, anemia, depression, renal insufficiency, history of carotid artery stenosis, history of peripheral vascular disease, history of hyperlipidemia, and hypertension.  Initially he was just admitted to the floor and was placed on Unasyn, but yesterday he was transferred to the Intensive Care Unit because of respiratory failure and was intubated. This apparently happened after vancomycin was started.   PHYSICAL EXAMINATION:  GENERAL: He is alert and oriented times 0, intubated.   VITAL SIGNS: His pulse is 50, respirations 16, blood pressure 144/68, temperature 98.   NECK: No JVD.   LUNGS: Good air entry.   HEART: Regular rate and rhythm. No audible murmur.   ABDOMEN: Soft, nontender, positive bowel sounds.   EXTREMITIES: Trace pedal edema.   NEUROLOGIC: He is alert and oriented times 0.   LABS/STUDIES: EKG shows sinus bradycardia at a rate of 45, left ventricular hypertrophy with nonspecific intraventricular conduction delay. BUN 53, creatinine 2.4. Sodium is 135. Troponin was 0.51 initially and now is 0.5. Hemoglobin is 8.7. pH is 7.46, pCO2 30, pO2 87.   ASSESSMENT AND PLAN: The patient has mildly elevated troponin and sinus bradycardia right now ranging between 45 and 50. He is maintaining his blood pressure. Mildly elevated troponin is due  to demand ischemia, most likely type II non-STEMI. No aggressive treatment is indicated for this at this time. There is no need for placement of a pacemaker. The patient is maintaining his blood pressure and is hemodynamically stable I agree with keeping atropine at the bedside, 1 mg. If need be then we will consider placing a pacemaker; at this time it is not indicated. Thank you very much for the referral.    ____________________________ Laurier NancyShaukat A. Stesha Neyens, MD sak:bjt D: 08/30/2011 09:09:02 ET T: 08/30/2011 09:59:33 ET JOB#: [id number]  cc: Laurier NancyShaukat A. Carel Schnee, MD, <Dictator> Laurier NancySHAUKAT A Mishka Stegemann MD ELECTRONICALLY SIGNED 09/02/2011 9:04

## 2014-09-08 NOTE — H&P (Signed)
PATIENT NAME:  William Orozco, MANNINEN MR#:  [id number] DATE OF BIRTH:  03-27-1936  DATE OF ADMISSION:  08/25/2011  REASON FOR ADMISSION: Osteomyelitis, right heel.   HISTORY OF PRESENT ILLNESS: The patient has fractured his ankle about five months ago. We had him progressing well with that. The bone was about healed. He was doing some physical therapy and walking in a boot. They had him walking quite a bit. He ended up wearing a blister on the back of his right heel which eventually ulcerated. We have tried to graft it with amniox. We had him on a Wound VAC but, unfortunately, he has developed a little bit of osteomyelitis on the back of his heel and is continuing to cause him some pain and irritation and likely some delayed or lack of healing across the region. His heel is somewhat anesthetic but he still has some pain associated with it in part due to the inflammation and the infection that's going on in the region. Recent MRI showed some increased bony edema in the posterior heel consistent with the area of the ulceration and likely some bone exposure associated with osteomyelitis.   PAST MEDICAL HISTORY:  1. Diabetes, type II, uncontrolled. 2. Urinary frequency. 3. Chronic stasis dermatitis. 4. Insomnia.  5. Gastric ulcer. 6. Anemia. 7. Depression. 8. Renal insufficiency, chronic. 9. History of carotid artery stenosis.  10. History of peripheral arterial disease. 11. Right hydrocele. 12. Systolic heart murmur.  13. Hyperlipidemia.  14. Hypertension.   CURRENT MEDICATIONS:  1. Tramadol HCl 50 mg every six hours as needed for pain. 2. NovoLog mix 70/30 through a FlexPen 100 units/mL suspension, 18 units with morning meal and 14 units with supper. 3. Furosemide 40 mg q.a.m.  4. Ergocalciferol daily. 5. Amlodipine besylate 10 mg daily.  6. Crestor 20 mg daily. 7. Colace 100 mg twice a day.  8. Citalopram 20 mg daily. 9. Doxepin HCl 25 mg 1 p.o. at bedtime.  10. Plavix 75 mg daily.   11. Protonix 40 mg daily. 12. Metoprolol 25 mg daily at bedtime.   PHYSICAL EXAMINATION:   GENERAL: He is a pleasant 79 year old male. He is alert.  LOWER EXTREMITY EXAM: He's had both great toes amputated by me in the past due to diabetic wounds and osteomyelitis. He's had peripheral arterial disease and angioplasties and stents placed in both lower extremities to help improve his peripheral circulation and, according to him, he was told that nothing else could be done so may want to get a vascular consult on him at some point to see what their thoughts on that are. He is going to need consults for Internal Medicine as well. On the heel there is an ulcer approximately 4 x 5 cm with a good granular base to it but one area where the bone was exposed posterosuperior on the region I think it had a slight exposure to it and this is the area that lit up the most on the MRI. There is a little erythema around the region. Part of the Achilles tendon has been exposed in the region as well.  CLINICAL IMPRESSION: Likely osteomyelitis posterior right heel secondary to chronic ulcer.   TREATMENT PLAN: We are going to admit him today and get him started with some Unasyn and keep him n.p.o. after midnight tonight and go ahead and plan on resecting some of this infected portion of the bone. Will see if I can get enough where we can close it up primarily and keep him on  IV antibiotics for a while and see how well he does with that. I will likely end up getting consultations from Vascular, Internal Medicine, as well as Infectious Disease during his hospital stay. He is scheduled for surgery tomorrow at [facility] and he's being admitted today to help get him started with antibiotics and get the infection under control.   ____________________________ Rhona RaiderMatthew G. Israella Hubert, DPM mgt:drc D: 08/25/2011 17:20:03 ET T: 08/25/2011 17:31:07 ET JOB#: [id number] Epimenio SarinMATTHEW G Ahria Slappey MD ELECTRONICALLY SIGNED 10/04/2011 10:40

## 2014-09-08 NOTE — Consult Note (Signed)
PATIENT NAME:  William Orozco, William Orozco MR#:  [id number] DATE OF BIRTH:  02-Jun-1935  DATE OF CONSULTATION:  09/05/2011  REFERRING PHYSICIAN:  Dr. Meredeth IdeFleming  CONSULTING PHYSICIAN:  Cammy CopaPaul H. Matteus Mcnelly, MD  PRIMARY CARE PHYSICIAN: Dr. Recardo EvangelistMatthew Troxler   REASON FOR CONSULTATION: Possible tracheostomy because of failure to wean.   HISTORY OF PRESENT ILLNESS: The patient is a 79 year old white male who was admitted to the hospital on 08/25/2011 because of osteomyelitis of his right heel. He has underlying diabetes, renal failure, and congestive heart failure and was being followed medically and treated with IV antibiotics awaiting debridement of the wound. On his fourth hospital day, he was found to be unresponsive and low respirations. He had a respiratory arrest and was bagged and intubated at that time. He has been somewhat encephalopathic since then and sedated. When he has not been sedated, he was trying to breathe on his own, going through a weaning trials, he goes to sleep, quits breathing, and becomes hypercapnic again. He has failed weaning trials four different times. He has been intubated one full week now and assessment is made for possibility of tracheostomy.   PAST MEDICAL HISTORY:  1. Diabetes mellitus type II. 2. Urinary frequency.  3. Chronic stasis dermatitis. 4. Insomnia. 5. Gastric ulcers. 6. Anemia. 7. Depression.  8. Renal insufficiency. 9. History of carotid artery stenosis. 10. History of peripheral arterial disease. 11. Right hydrocele. 12. Systolic heart murmur. 13. Hyperlipidemia. 14. Hypertension.  15. The patient had a tracheostomy tube in 2008 for a while that was subsequently removed and he has done well with his airway since that time.    CURRENT MEDICATIONS: Noted on the chart and reviewed.   DRUG ALLERGIES: Aspirin, Neurontin, and possibly vancomycin.   SOCIAL HISTORY: The patient lives in independent housing by himself. He does not smoke. He does not drink. He has no pets  at home.   FAMILY HISTORY: Positive for lung cancer and coronary artery disease.   REVIEW OF SYSTEMS: Unable to obtain from the patient due his current clinical condition.   PHYSICAL EXAMINATION: The patient is supine in bed with his back elevated slightly. He is on the vent with orotracheal tube. He is sedated. He will move his eyes slightly when I evaluate him. The nose looks open anteriorly. The mouth has the tube in it. He does not open the mouth. He has a very short fat neck with a very large double chin. He has evidence of previous trach tube with scarring just above the sternal notch. You could feel the trachea in the midline beneath this where it's relatively thin there but you have to extend the neck and life the chin up to be able to find this area as it's just covered by folds of skin. There were no neck masses palpable.   IMPRESSION: The patient is a 79 year old well male with previous history of diabetes, chronic renal disease, peripheral vascular disease and osteomyelitis of his right heel. He had a respiratory arrest one week ago and has failed weaning four different times and is certainly a candidate for tracheostomy. He does not appear to be close to weaning at this time. Tracheostomy will allow him to get off of sedation. It will help prevent further infection in his lungs and because it will give a tube that has an inner cannula that can be cleaned. It will also decrease some dead space in the tube and facilitate weaning from the vent. I've discussed this with his twin brother  and will also speak with his daughter. The possible timing is sometime mid week if everyone agrees. It is likely he has obstructive sleep apnea and may need continued help with ventilatory support, weaning, and even CPAP once he is no longer on the vent.   ____________________________ Cammy Copa, MD phj:drc D: 09/05/2011 15:22:12 ET T: 09/05/2011 15:58:08 ET JOB#: [id number]  cc: Cammy Copa, MD,  <Dictator> Cammy Copa MD ELECTRONICALLY SIGNED 09/13/2011 13:15
